# Patient Record
Sex: Female | Born: 1955
Health system: Southern US, Community
[De-identification: ages and names within clinical notes are randomized; demographics above are authoritative.]

## PROBLEM LIST (undated history)

## (undated) DIAGNOSIS — C50511 Malignant neoplasm of lower-outer quadrant of right female breast: Secondary | ICD-10-CM

## (undated) DIAGNOSIS — C50919 Malignant neoplasm of unspecified site of unspecified female breast: Secondary | ICD-10-CM

## (undated) DIAGNOSIS — Z923 Personal history of irradiation: Secondary | ICD-10-CM

## (undated) DIAGNOSIS — M722 Plantar fascial fibromatosis: Secondary | ICD-10-CM

## (undated) HISTORY — DX: Plantar fascial fibromatosis: M72.2

## (undated) HISTORY — PX: TONSILLECTOMY: SUR1361

## (undated) HISTORY — PX: FEMUR FRACTURE SURGERY: SHX633

---

## 2002-05-01 ENCOUNTER — Encounter: Admission: RE | Admit: 2002-05-01 | Discharge: 2002-05-01 | Payer: Self-pay | Admitting: Internal Medicine

## 2002-05-01 ENCOUNTER — Encounter: Payer: Self-pay | Admitting: Internal Medicine

## 2003-05-14 ENCOUNTER — Encounter: Admission: RE | Admit: 2003-05-14 | Discharge: 2003-05-14 | Payer: Self-pay | Admitting: Internal Medicine

## 2004-09-03 ENCOUNTER — Other Ambulatory Visit: Admission: RE | Admit: 2004-09-03 | Discharge: 2004-09-03 | Payer: Self-pay | Admitting: Obstetrics and Gynecology

## 2004-09-23 ENCOUNTER — Encounter: Admission: RE | Admit: 2004-09-23 | Discharge: 2004-09-23 | Payer: Self-pay | Admitting: Internal Medicine

## 2004-10-13 ENCOUNTER — Encounter (INDEPENDENT_AMBULATORY_CARE_PROVIDER_SITE_OTHER): Payer: Self-pay | Admitting: Specialist

## 2004-10-13 ENCOUNTER — Ambulatory Visit (HOSPITAL_COMMUNITY): Admission: RE | Admit: 2004-10-13 | Discharge: 2004-10-13 | Payer: Self-pay | Admitting: *Deleted

## 2005-09-25 ENCOUNTER — Other Ambulatory Visit: Admission: RE | Admit: 2005-09-25 | Discharge: 2005-09-25 | Payer: Self-pay | Admitting: Obstetrics and Gynecology

## 2005-09-30 ENCOUNTER — Encounter: Admission: RE | Admit: 2005-09-30 | Discharge: 2005-09-30 | Payer: Self-pay | Admitting: Internal Medicine

## 2005-11-21 ENCOUNTER — Inpatient Hospital Stay (HOSPITAL_COMMUNITY): Admission: EM | Admit: 2005-11-21 | Discharge: 2005-11-29 | Payer: Self-pay | Admitting: Emergency Medicine

## 2006-12-30 ENCOUNTER — Encounter: Admission: RE | Admit: 2006-12-30 | Discharge: 2006-12-30 | Payer: Self-pay | Admitting: Family Medicine

## 2008-12-05 ENCOUNTER — Encounter: Admission: RE | Admit: 2008-12-05 | Discharge: 2008-12-05 | Payer: Self-pay | Admitting: Family Medicine

## 2009-03-21 ENCOUNTER — Other Ambulatory Visit: Admission: RE | Admit: 2009-03-21 | Discharge: 2009-03-21 | Payer: Self-pay | Admitting: Family Medicine

## 2010-02-16 ENCOUNTER — Encounter: Payer: Self-pay | Admitting: Family Medicine

## 2010-06-13 NOTE — Discharge Summary (Signed)
[name], Mitchell              ACCOUNT NO.:  [id number]   MEDICAL RECORD NO.:  [id number]          PATIENT TYPE:  INP   LOCATION:  5019                         FACILITY:  [facility]   PHYSICIAN:  Vanita Panda. Magnus Ivan, M.D.DATE OF BIRTH:  [birth year]   DATE OF ADMISSION:  11/21/2005  DATE OF DISCHARGE:  11/29/2005                                 DISCHARGE SUMMARY   ADMITTING DIAGNOSIS:  Left intertrochanteric hip fracture.   DISCHARGE DIAGNOSIS:  Status post open reduction internal fixation of left  intertrochanteric hip fracture.   PROCEDURES:  Open reduction internal fixation of left intertrochanteric hip  fracture on 11/21/2005.   HOSPITAL COURSE:  Briefly Teresa Mitchell is a 55 year old who had accident  involving being thrown off of a horse.  On the day of admission she was  found to have a completely displaced left intertrochanteric hip fracture of  the proximal femur.  She was taken to the operating room on the day of  admission where she underwent open reduction internal fixation using  intramedullary implant.  She was then admitted to orthopedic floor bed with  only touchdown weightbearing on that leg.  She did have an acute blood loss  anemia from her surgery and accident and did require a transfusion of 2  units of packed red blood cells during her admission.  She began working  with physical therapy, occupational therapy and by the day of discharge was  deemed safe for discharge to home with home health therapy set up to  continue help with ambulation.  The remainder of hospital course was  uneventful and she was transitioned to oral pain medications as well as  tolerated a regular diet as well.   DISPOSITION:  To home.   DISCHARGE MEDICATIONS:  While at home she should continue on a stool  softener as well as iron sulfate 300 mg p.o. t.i.d. for the next week.  Also  provide her prescriptions for oxycodone and Robaxin p.r.n. for pain and  spasms.  A follow-up  appointment should be established in Dr. Eliberto Ivory  office in a week after discharge.  She can get her incision wet starting to  shower on Tuesday and should just keep it clean and dry and change dressings  p.r.n.           ______________________________  Vanita Panda. Magnus Ivan, M.D.     CYB/MEDQ  D:  11/29/2005  T:  11/30/2005  Job:  [id number]

## 2010-06-13 NOTE — Op Note (Signed)
NAMESHARMANE, DAME              ACCOUNT NO.:  [id number]   MEDICAL RECORD NO.:  [id number]          PATIENT TYPE:  INP   LOCATION:  1826                         FACILITY:  [facility]   PHYSICIAN:  Vanita Panda. Magnus Ivan, M.D.DATE OF BIRTH:  Jun 25, 1955   DATE OF PROCEDURE:  11/22/2005  DATE OF DISCHARGE:                                 OPERATIVE REPORT   PREOPERATIVE DIAGNOSIS:  Left comminuted and significantly displaced  intertrochanteric femur fracture.   POSTOPERATIVE DIAGNOSIS:  Left comminuted and significantly displaced  intertrochanteric femur fracture.   PROCEDURE:  Open reduction internal fixation of left intertrochanteric femur  fracture using intramedullary hip screw and allograft bone graft.   IMPLANTS:  Smith and Nephew 10 x 34 intramedullary hip nail with 85 mm lag  screws.   SURGEON:  Vanita Panda. Magnus Ivan, M.D.   ANESTHESIA:  General.   ANTIBIOTICS:  1 gram IV Ancef.   BLOOD LOSS:  400 mL.   COMPLICATIONS:  None.   INDICATIONS:  Briefly Ms. Mamula is a 50-year who had an accident involving  a horse this afternoon, when the horse was going at a high rate of speed and  she fell and landed on her left hip.  She was transported via EMS to [facility] emergency room where x-rays were obtained.  She is found to have a 100%  displaced and comminuted intertrochanteric femur fracture.  There was  significant rotation in this area and it was recommend she under go fixation  using a hip screw type of device.  The risks, benefits of this were  explained to her and well understood and she agreed to proceed with surgery.   PROCEDURE:  After informed consent was obtained and appropriate left leg was  marked, she was brought to the operating room, placed supine on the fracture  table.  General anesthesia was then obtained.  I then placed the affected  left leg in longitudinal traction, the right leg was abducted and flexed out  of the way with a leg holder with  appropriate padding.  Her hip was prepped  and draped with DuraPrep and sterile drapes and sterile shower curtain was  utilized.  Right away it was noted significant comminution of the fracture  site and under live fluoro before making the incision, I tried to perform in-  line traction and rotation to the fracture as close as possible and this was  quite difficult.  I then made decision to make large incision over the hip  centered over the greater trochanter, because I knew that the reduction  would be difficult.  Right away I found significant comminution of the  greater trochanter and significant rotation of fracture.  Further  manipulation with adduction and internal rotation helped me reduced the  fracture to near anatomic but it was still quite difficult due to the nature  of the comminution.  I held the fracture fragments in place and then I used  initiating reamer to open up the greater trochanter with size 17 mm reamer.  I then placed a guide pin once the intramedullary canal was opened and  reamed the femur in 5 mm increments from 9 mm up to 11 mm.  I then chose a  10 x 34 mm IMHS after measuring the length of the femur.  I then placed this  in antegrade manner, holding the fractures as anatomically reduced as  possible.  However, again there was noted to be such heavy significant  comminution, that it was difficult to hold this.  Next a lag screw was then  drilled through the lateral cortex through a separate stab incision and I  measured this to be a 85 mm lag screw and was able to verify this in AP and  lateral positions and then brought the C-arm in 5-mm increments from a  direct lateral up to the AP to confirm that the guide pin was in a center-  center position.  Next this was drilled and then I tapped this and then  placed a guide pin.  I tried to place a compression component as well to  compress the fracture.  Again significant comminution as well as rotation  was noted  and I do feel I got as maximized a rotated and anatomically  reduced position as possible.  I then irrigated the wound thoroughly and  placed cancellous chips all around the fracture site.  I then closed the  deep layer with interrupted #1 Vicryl suture followed by 0 Vicryl in deeper  tissues, 2-0 Vicryl in subcutaneous tissue and staples on the skin also and  closed the separate incision for lag screw with 2-0 Vicryl and staples as  well.  I under direct fluoroscopy then put the leg through a range of motion  and there was no block of this motion and it did move as a unit under  fluoroscopy.  Her leg length once I took her off the fracture table appeared  normal with several rotation.  We certainly we did place xeroform and  sterile dressings, well-padded dressing over the wound, prior to moving her  off the table.  She was then awakened, extubated, taken to recovery room in  stable condition.  Final counts were correct and there were no complications  noted.  Again final blood loss was 400 mL.  Postoperatively I will likely  have her just touchdown weightbearing due to the significant comminution of  the fracture.           ______________________________  Vanita Panda. Magnus Ivan, M.D.     CYB/MEDQ  D:  11/22/2005  T:  11/23/2005  Job:  [id number]

## 2010-06-13 NOTE — Op Note (Signed)
Teresa Mitchell, Teresa Mitchell              ACCOUNT NO.:  [id number]   MEDICAL RECORD NO.:  [id number]          PATIENT TYPE:  AMB   LOCATION:  ENDO                         FACILITY:  [facility]   PHYSICIAN:  Georgiana Spinner, M.D.    DATE OF BIRTH:  1955/09/27   DATE OF PROCEDURE:  10/13/2004  DATE OF DISCHARGE:                                 OPERATIVE REPORT   PROCEDURE:  Colonoscopy with biopsy and polypectomy.   INDICATIONS:  Colon polyp.   PROCEDURE:  With patient mildly sedated in the left lateral decubitus  position, the Olympus videoscopic colonoscope was inserted in the rectum and  then passed under direct vision to the cecum, identified by ileocecal valve  and appendiceal orifice, both which were photographed.  From this point, the  colonoscope was slowly withdrawn taking circumferential views of colonic  mucosa, stopping at 20 cm from anal verge where a small polyp was seen and  removed using snare cautery technique, setting of 20/20 blended current.  We  next stopped in the rectum which appeared normal on direct and showed  another polyp on retroflexed view that was removed using hot biopsy forceps  technique with the same setting.  The endoscope was straightened and  withdrawn.  The patient's vital signs and pulse oximeter remained stable.  The patient tolerated the procedure well without complication.   FINDINGS:  Two small polyps, one at 20 cm, the other in the rectum,  otherwise unremarkable colonoscopic examination to the cecum.   PLAN:  Await biopsy report.  The patient will call me for results and follow-  up with me as an outpatient.           ______________________________  Georgiana Spinner, M.D.     GMO/MEDQ  D:  10/13/2004  T:  10/13/2004  Job:  [id number]   cc:   Darius Bump, M.D.  [address]  [place] [address]  Fax: [phone]

## 2013-08-15 ENCOUNTER — Ambulatory Visit (INDEPENDENT_AMBULATORY_CARE_PROVIDER_SITE_OTHER): Payer: No Typology Code available for payment source

## 2013-08-15 ENCOUNTER — Ambulatory Visit (INDEPENDENT_AMBULATORY_CARE_PROVIDER_SITE_OTHER): Payer: No Typology Code available for payment source | Admitting: Podiatry

## 2013-08-15 ENCOUNTER — Encounter: Payer: Self-pay | Admitting: Podiatry

## 2013-08-15 VITALS — BP 132/83 | HR 65 | Resp 16 | Ht 61.0 in | Wt 130.0 lb

## 2013-08-15 DIAGNOSIS — M722 Plantar fascial fibromatosis: Secondary | ICD-10-CM

## 2013-08-15 DIAGNOSIS — M21619 Bunion of unspecified foot: Secondary | ICD-10-CM

## 2013-08-15 DIAGNOSIS — M21612 Bunion of left foot: Secondary | ICD-10-CM

## 2013-08-15 MED ORDER — METHYLPREDNISOLONE (PAK) 4 MG PO TABS: ORAL_TABLET | ORAL | Status: DC

## 2013-08-15 MED ORDER — MELOXICAM 15 MG PO TABS: 15.0000 mg | ORAL_TABLET | Freq: Every day | ORAL | Status: DC

## 2013-08-15 NOTE — Progress Notes (Signed)
   Subjective:    Patient ID: Teresa Mitchell, female    DOB: 09-30-1955, 58 y.o.   MRN: [id number]  HPI Comments: Right plantar heel pain. It started around the 4th of July. Sharp pain plantar heel which has moved to around the sides of the heel. It is worse in the mornings. Using a heel cups to help with the pain. Left hallux pain   Foot Pain      Review of Systems  All other systems reviewed and are negative.      Objective:   Physical Exam: I have reviewed her past medical history medications allergies surgeries social history and review of systems. Pulses are strongly palpable bilateral. Neurologic sensorium is intact per Semmes-Weinstein monofilament. Deep tendon reflexes are intact bilateral muscle strength is 5 over 5 flexors plantar flexors inverters everters all intrinsic musculature is intact. Orthopedic evaluation demonstrates pain on palpation medial continued tubercle of the right heel. She also has mild hallux valgus deformity of the left foot. Radiographic evaluation does demonstrate soft tissue increase in density at the plantar fascial calcaneal insertion site of the right heel. Minimal increase in the first intermetatarsal angle left foot.        Assessment & Plan:  Assessment: Plantar fasciitis right. Mild hallux valgus left.  Plan: Discussed etiology pathology conservative versus surgical therapies. Injected the right heel today with Kenalog and local anesthetic. Put her in a plantar fascial brace and dispensed a night splint. Wrote her prescription for Medrol Dosepak to be followed by meloxicam and discussed appropriate shoe gear stretching exercises ice therapy and shoe gear modifications. We will also followup with her in one month.

## 2013-08-15 NOTE — Patient Instructions (Signed)
Plantar Fasciitis (Heel Spur Syndrome) with Rehab The plantar fascia is a fibrous, ligament-like, soft-tissue structure that spans the bottom of the foot. Plantar fasciitis is a condition that causes pain in the foot due to inflammation of the tissue. SYMPTOMS   Pain and tenderness on the underneath side of the foot.  Pain that worsens with standing or walking. CAUSES  Plantar fasciitis is caused by irritation and injury to the plantar fascia on the underneath side of the foot. Common mechanisms of injury include:  Direct trauma to bottom of the foot.  Damage to a small nerve that runs under the foot where the main fascia attaches to the heel bone.  Stress placed on the plantar fascia due to bone spurs. RISK INCREASES WITH:   Activities that place stress on the plantar fascia (running, jumping, pivoting, or cutting).  Poor strength and flexibility.  Improperly fitted shoes.  Tight calf muscles.  Flat feet.  Failure to warm-up properly before activity.  Obesity. PREVENTION  Warm up and stretch properly before activity.  Allow for adequate recovery between workouts.  Maintain physical fitness:  Strength, flexibility, and endurance.  Cardiovascular fitness.  Maintain a health body weight.  Avoid stress on the plantar fascia.  Wear properly fitted shoes, including arch supports for individuals who have flat feet. PROGNOSIS  If treated properly, then the symptoms of plantar fasciitis usually resolve without surgery. However, occasionally surgery is necessary. RELATED COMPLICATIONS   Recurrent symptoms that may result in a chronic condition.  Problems of the lower back that are caused by compensating for the injury, such as limping.  Pain or weakness of the foot during push-off following surgery.  Chronic inflammation, scarring, and partial or complete fascia tear, occurring more often from repeated injections. TREATMENT  Treatment initially involves the use of  ice and medication to help reduce pain and inflammation. The use of strengthening and stretching exercises may help reduce pain with activity, especially stretches of the Achilles tendon. These exercises may be performed at home or with a therapist. Your caregiver may recommend that you use heel cups of arch supports to help reduce stress on the plantar fascia. Occasionally, corticosteroid injections are given to reduce inflammation. If symptoms persist for greater than 6 months despite non-surgical (conservative), then surgery may be recommended.  MEDICATION   If pain medication is necessary, then nonsteroidal anti-inflammatory medications, such as aspirin and ibuprofen, or other minor pain relievers, such as acetaminophen, are often recommended.  Do not take pain medication within 7 days before surgery.  Prescription pain relievers may be given if deemed necessary by your caregiver. Use only as directed and only as much as you need.  Corticosteroid injections may be given by your caregiver. These injections should be reserved for the most serious cases, because they may only be given a certain number of times. HEAT AND COLD  Cold treatment (icing) relieves pain and reduces inflammation. Cold treatment should be applied for 10 to 15 minutes every 2 to 3 hours for inflammation and pain and immediately after any activity that aggravates your symptoms. Use ice packs or massage the area with a piece of ice (ice massage).  Heat treatment may be used prior to performing the stretching and strengthening activities prescribed by your caregiver, physical therapist, or athletic trainer. Use a heat pack or soak the injury in warm water. SEEK IMMEDIATE MEDICAL CARE IF:  Treatment seems to offer no benefit, or the condition worsens.  Any medications produce adverse side effects. EXERCISES RANGE   OF MOTION (ROM) AND STRETCHING EXERCISES - Plantar Fasciitis (Heel Spur Syndrome) These exercises may help you  when beginning to rehabilitate your injury. Your symptoms may resolve with or without further involvement from your physician, physical therapist or athletic trainer. While completing these exercises, remember:   Restoring tissue flexibility helps normal motion to return to the joints. This allows healthier, less painful movement and activity.  An effective stretch should be held for at least 30 seconds.  A stretch should never be painful. You should only feel a gentle lengthening or release in the stretched tissue. RANGE OF MOTION - Toe Extension, Flexion  Sit with your right / left leg crossed over your opposite knee.  Grasp your toes and gently pull them back toward the top of your foot. You should feel a stretch on the bottom of your toes and/or foot.  Hold this stretch for __________ seconds.  Now, gently pull your toes toward the bottom of your foot. You should feel a stretch on the top of your toes and or foot.  Hold this stretch for __________ seconds. Repeat __________ times. Complete this stretch __________ times per day.  RANGE OF MOTION - Ankle Dorsiflexion, Active Assisted  Remove shoes and sit on a chair that is preferably not on a carpeted surface.  Place right / left foot under knee. Extend your opposite leg for support.  Keeping your heel down, slide your right / left foot back toward the chair until you feel a stretch at your ankle or calf. If you do not feel a stretch, slide your bottom forward to the edge of the chair, while still keeping your heel down.  Hold this stretch for __________ seconds. Repeat __________ times. Complete this stretch __________ times per day.  STRETCH - Gastroc, Standing  Place hands on wall.  Extend right / left leg, keeping the front knee somewhat bent.  Slightly point your toes inward on your back foot.  Keeping your right / left heel on the floor and your knee straight, shift your weight toward the wall, not allowing your back to  arch.  You should feel a gentle stretch in the right / left calf. Hold this position for __________ seconds. Repeat __________ times. Complete this stretch __________ times per day. STRETCH - Soleus, Standing  Place hands on wall.  Extend right / left leg, keeping the other knee somewhat bent.  Slightly point your toes inward on your back foot.  Keep your right / left heel on the floor, bend your back knee, and slightly shift your weight over the back leg so that you feel a gentle stretch deep in your back calf.  Hold this position for __________ seconds. Repeat __________ times. Complete this stretch __________ times per day. STRETCH - Gastrocsoleus, Standing  Note: This exercise can place a lot of stress on your foot and ankle. Please complete this exercise only if specifically instructed by your caregiver.   Place the ball of your right / left foot on a step, keeping your other foot firmly on the same step.  Hold on to the wall or a rail for balance.  Slowly lift your other foot, allowing your body weight to press your heel down over the edge of the step.  You should feel a stretch in your right / left calf.  Hold this position for __________ seconds.  Repeat this exercise with a slight bend in your right / left knee. Repeat __________ times. Complete this stretch __________ times per day.    STRENGTHENING EXERCISES - Plantar Fasciitis (Heel Spur Syndrome)  These exercises may help you when beginning to rehabilitate your injury. They may resolve your symptoms with or without further involvement from your physician, physical therapist or athletic trainer. While completing these exercises, remember:   Muscles can gain both the endurance and the strength needed for everyday activities through controlled exercises.  Complete these exercises as instructed by your physician, physical therapist or athletic trainer. Progress the resistance and repetitions only as guided. STRENGTH -  Towel Curls  Sit in a chair positioned on a non-carpeted surface.  Place your foot on a towel, keeping your heel on the floor.  Pull the towel toward your heel by only curling your toes. Keep your heel on the floor.  If instructed by your physician, physical therapist or athletic trainer, add ____________________ at the end of the towel. Repeat __________ times. Complete this exercise __________ times per day. STRENGTH - Ankle Inversion  Secure one end of a rubber exercise band/tubing to a fixed object (table, pole). Loop the other end around your foot just before your toes.  Place your fists between your knees. This will focus your strengthening at your ankle.  Slowly, pull your big toe up and in, making sure the band/tubing is positioned to resist the entire motion.  Hold this position for __________ seconds.  Have your muscles resist the band/tubing as it slowly pulls your foot back to the starting position. Repeat __________ times. Complete this exercises __________ times per day.  Document Released: 01/12/2005 Document Revised: 04/06/2011 Document Reviewed: 04/26/2008 [employer] Patient Information 2015 [employer]. This information is not intended to replace advice given to you by your health care provider. Make sure you discuss any questions you have with your health care provider.  

## 2013-09-05 ENCOUNTER — Ambulatory Visit: Payer: Self-pay | Admitting: Podiatry

## 2013-09-12 ENCOUNTER — Encounter: Payer: Self-pay | Admitting: Podiatry

## 2013-09-12 ENCOUNTER — Ambulatory Visit (INDEPENDENT_AMBULATORY_CARE_PROVIDER_SITE_OTHER): Payer: No Typology Code available for payment source | Admitting: Podiatry

## 2013-09-12 VITALS — BP 126/73 | HR 62 | Resp 16

## 2013-09-12 DIAGNOSIS — M722 Plantar fascial fibromatosis: Secondary | ICD-10-CM

## 2013-09-12 NOTE — Progress Notes (Signed)
She presents today for followup of her plantar fasciitis. She states it is approximately 70-75% better.  Objective: Vital signs are stable she is alert and oriented x3. She is pain on palpation medial calcaneal tubercle to the right heel.  Assessment: Plantar fasciitis right foot.  Plan: Continue all conservative therapies and reinjected today with Kenalog and local anesthetic.

## 2015-01-30 ENCOUNTER — Encounter: Payer: Self-pay | Admitting: Allergy and Immunology

## 2015-01-30 ENCOUNTER — Ambulatory Visit (INDEPENDENT_AMBULATORY_CARE_PROVIDER_SITE_OTHER): Payer: BLUE CROSS/BLUE SHIELD | Admitting: Allergy and Immunology

## 2015-01-30 VITALS — BP 106/68 | HR 80 | Temp 97.9°F | Resp 20 | Ht 61.42 in | Wt 134.7 lb

## 2015-01-30 DIAGNOSIS — T7800XA Anaphylactic reaction due to unspecified food, initial encounter: Secondary | ICD-10-CM | POA: Diagnosis not present

## 2015-01-30 DIAGNOSIS — R1013 Epigastric pain: Secondary | ICD-10-CM

## 2015-01-30 DIAGNOSIS — R109 Unspecified abdominal pain: Secondary | ICD-10-CM | POA: Insufficient documentation

## 2015-01-30 NOTE — Patient Instructions (Addendum)
Food allergy Penda's history suggests the possibility of food allergy, particularly galactose-alpha-1,3-galactose (alpha-gal) hypersensitivity.  We will proceed with lab work to rule out alpha-gal sensitivity. Food allergen skin tests today were negative with the exception of pineapple. However Kelly eats this food on a regular basis without symptoms, therefore this represents a false positive.  A lab order has been provided for serum specific IgE against beef, pork, and lamb, as well as galactose-alpha-1,3-galactose IgE level.  Until this hypersensitivity has been ruled out, she is to meticulously avoid mammalian meat.   When lab results have returned the patient will be called with further recommendations and follow up instructions.

## 2015-01-30 NOTE — Progress Notes (Signed)
[facility]  New Patient Note  RE: Teresa Mitchell MRN: [id number] DOB: Oct 17, 1955 Date of Office Visit: 01/30/2015  Referring provider: Kelton Pillar, MD Primary care provider: Osborne Casco, MD  Chief Complaint: Food Intolerance   History of present illness: HPI Comments: Teresa Mitchell is a 59 y.o. female who presents today for her initial consultation of a possible food allergy. She reports that for approximately the past 6 months she has experienced episodes of abdominal discomfort, bloating, flatulence and diarrhea that these symptoms are triggered by the consumption of beef and, to a lesser degree, pork. She denies concomitant cutaneous or pulmonary symptoms.  She has a history of multiple tick bites.  She is able to eat poultry, fish, and seafood without symptoms.   Assessment and plan: Food allergy Teresa Mitchell's history suggests the possibility of food allergy, particularly galactose-alpha-1,3-galactose (alpha-gal) hypersensitivity.  We will proceed with lab work to rule out alpha-gal sensitivity. Food allergen skin tests today were negative with the exception of pineapple. However Teresa Mitchell eats this food on a regular basis without symptoms, therefore this represents a false positive.  A lab order has been provided for serum specific IgE against beef, pork, and lamb, as well as galactose-alpha-1,3-galactose IgE level.  Until this hypersensitivity has been ruled out, she is to meticulously avoid mammalian meat.    No orders of the defined types were placed in this encounter.    Diagnositics: Food allergen skin testing: Positive to pineapple.    Physical examination: Blood pressure 106/68, pulse 80, temperature 97.9 F (36.6 C), temperature source Oral, resp. rate 20, height 5' 1.42" (1.56 m), weight 134 lb 11.2 oz (61.1 kg).  General: Alert, interactive, in no acute distress. Lungs: Clear to auscultation  without wheezing, rhonchi or rales. CV: Normal S1, S2 without murmurs. Abdomen: Nondistended, nontender. Skin: Warm and dry, without lesions or rashes. Extremities:  No clubbing, cyanosis or edema. Neuro:   Grossly intact.  Review of systems: Review of Systems  Constitutional: Negative for fever, chills and weight loss.  HENT: Negative for congestion and nosebleeds.   Eyes: Negative for blurred vision.  Respiratory: Negative for hemoptysis, shortness of breath and wheezing.   Cardiovascular: Negative for chest pain.  Gastrointestinal: Positive for abdominal pain and diarrhea. Negative for constipation.  Genitourinary: Negative for dysuria.  Musculoskeletal: Negative for myalgias and joint pain.  Skin: Negative for itching and rash.  Neurological: Negative for dizziness.  Endo/Heme/Allergies: Does not bruise/bleed easily.    Past medical history: No past medical history on file.  Past surgical history: Past Surgical History  Procedure Laterality Date  . Femur fracture surgery Left   . Tonsillectomy      Family history: Family History  Problem Relation Age of Onset  . Asthma Maternal Grandfather   . Allergic rhinitis Neg Hx   . Angioedema Neg Hx   . Atopy Neg Hx   . Eczema Neg Hx   . Immunodeficiency Neg Hx   . Urticaria Neg Hx   . Migraines Maternal Grandmother     Social history: Social History   Social History  . Marital Status: Married    Spouse Name: N/A  . Number of Children: N/A  . Years of Education: N/A   Occupational History  . Not on file.   Social History Main Topics  . Smoking status: Never Smoker   . Smokeless tobacco: Never Used  . Alcohol Use: Not on file  . Drug Use: Not on  file  . Sexual Activity: Not on file   Other Topics Concern  . Not on file   Social History Narrative   Environmental History:  Teresa Mitchell lives in an 61 year old house with carpeting throughout and central air/heat.  There are 2 dogs and 1 cat in the house which do  not have access to her bedroom.  She is a former cigarette smoker.    Medication List       This list is accurate as of: 01/30/15 10:18 AM.  Always use your most recent med list.               BLACK COHOSH PO  Take by mouth.     Fish Oil Oil  by Does not apply route.     MAGNESIUM ASPARTATE PO  Take by mouth.     meloxicam 15 MG tablet  Commonly known as:  MOBIC  Take 1 tablet (15 mg total) by mouth daily.     MULTIVITAMIN & MINERAL PO  Take by mouth.     pravastatin 20 MG tablet  Commonly known as:  PRAVACHOL  Take 20 mg by mouth daily.     vitamin B-12 100 MCG tablet  Commonly known as:  CYANOCOBALAMIN  Take 100 mcg by mouth daily.        Known medication allergies: No Known Allergies  I appreciate the opportunity to take part in this Teresa Mitchell's care. Please do not hesitate to contact me with questions.  Sincerely,   R. Edgar Frisk, MD

## 2015-01-30 NOTE — Assessment & Plan Note (Addendum)
Aftan's history suggests the possibility of food allergy, particularly galactose-alpha-1,3-galactose (alpha-gal) hypersensitivity.  We will proceed with lab work to rule out alpha-gal sensitivity. Food allergen skin tests today were negative with the exception of pineapple. However Kamrin eats this food on a regular basis without symptoms, therefore this represents a false positive.  A lab order has been provided for serum specific IgE against beef, pork, and lamb, as well as galactose-alpha-1,3-galactose IgE level.  Until this hypersensitivity has been ruled out, she is to meticulously avoid mammalian meat.

## 2015-02-01 LAB — TRYPTASE: Tryptase: 3.7 ug/L (ref <11)

## 2015-02-04 LAB — ALPHA-GAL PANEL
Allergen, Mutton, f88: <0.1 kU/L
Allergen, Pork, f26: <0.1 kU/L
Beef: <0.1 kU/L
Galactose-alpha-1,3-galactose IgE: 0.34 kU/L (ref <0.35)

## 2015-03-11 ENCOUNTER — Other Ambulatory Visit: Payer: Self-pay | Admitting: Gastroenterology

## 2016-01-09 DIAGNOSIS — Z0000 Encounter for general adult medical examination without abnormal findings: Secondary | ICD-10-CM | POA: Diagnosis not present

## 2016-01-09 DIAGNOSIS — Z1159 Encounter for screening for other viral diseases: Secondary | ICD-10-CM | POA: Diagnosis not present

## 2016-01-09 DIAGNOSIS — E7800 Pure hypercholesterolemia, unspecified: Secondary | ICD-10-CM | POA: Diagnosis not present

## 2016-01-09 DIAGNOSIS — M1990 Unspecified osteoarthritis, unspecified site: Secondary | ICD-10-CM | POA: Diagnosis not present

## 2016-01-09 DIAGNOSIS — K219 Gastro-esophageal reflux disease without esophagitis: Secondary | ICD-10-CM | POA: Diagnosis not present

## 2016-01-27 DIAGNOSIS — C50919 Malignant neoplasm of unspecified site of unspecified female breast: Secondary | ICD-10-CM

## 2016-01-27 DIAGNOSIS — Z923 Personal history of irradiation: Secondary | ICD-10-CM

## 2016-01-27 HISTORY — DX: Personal history of irradiation: Z92.3

## 2016-01-27 HISTORY — DX: Malignant neoplasm of unspecified site of unspecified female breast: C50.919

## 2016-02-25 DIAGNOSIS — Z1389 Encounter for screening for other disorder: Secondary | ICD-10-CM | POA: Diagnosis not present

## 2016-02-25 DIAGNOSIS — Z6827 Body mass index (BMI) 27.0-27.9, adult: Secondary | ICD-10-CM | POA: Diagnosis not present

## 2016-02-25 DIAGNOSIS — Z01419 Encounter for gynecological examination (general) (routine) without abnormal findings: Secondary | ICD-10-CM | POA: Diagnosis not present

## 2016-02-25 DIAGNOSIS — Z130 Encounter for screening for diseases of the blood and blood-forming organs and certain disorders involving the immune mechanism: Secondary | ICD-10-CM | POA: Diagnosis not present

## 2016-02-25 DIAGNOSIS — Z1231 Encounter for screening mammogram for malignant neoplasm of breast: Secondary | ICD-10-CM | POA: Diagnosis not present

## 2016-02-27 ENCOUNTER — Other Ambulatory Visit: Payer: Self-pay | Admitting: Obstetrics and Gynecology

## 2016-02-27 DIAGNOSIS — R928 Other abnormal and inconclusive findings on diagnostic imaging of breast: Secondary | ICD-10-CM

## 2016-02-28 ENCOUNTER — Ambulatory Visit
Admission: RE | Admit: 2016-02-28 | Discharge: 2016-02-28 | Disposition: A | Discharge status: Home / Self Care | Payer: BLUE CROSS/BLUE SHIELD | Source: Ambulatory Visit | Attending: Obstetrics and Gynecology | Admitting: Obstetrics and Gynecology

## 2016-02-28 ENCOUNTER — Other Ambulatory Visit: Payer: Self-pay | Admitting: Obstetrics and Gynecology

## 2016-02-28 DIAGNOSIS — R928 Other abnormal and inconclusive findings on diagnostic imaging of breast: Secondary | ICD-10-CM

## 2016-02-28 DIAGNOSIS — N6310 Unspecified lump in the right breast, unspecified quadrant: Secondary | ICD-10-CM

## 2016-02-28 DIAGNOSIS — N6311 Unspecified lump in the right breast, upper outer quadrant: Secondary | ICD-10-CM | POA: Diagnosis not present

## 2016-03-02 ENCOUNTER — Ambulatory Visit
Admission: RE | Admit: 2016-03-02 | Discharge: 2016-03-02 | Disposition: A | Discharge status: Home / Self Care | Payer: BLUE CROSS/BLUE SHIELD | Source: Ambulatory Visit | Attending: Obstetrics and Gynecology | Admitting: Obstetrics and Gynecology

## 2016-03-02 ENCOUNTER — Other Ambulatory Visit: Payer: Self-pay | Admitting: Obstetrics and Gynecology

## 2016-03-02 DIAGNOSIS — N6310 Unspecified lump in the right breast, unspecified quadrant: Secondary | ICD-10-CM

## 2016-03-02 DIAGNOSIS — C50511 Malignant neoplasm of lower-outer quadrant of right female breast: Secondary | ICD-10-CM | POA: Diagnosis not present

## 2016-03-04 ENCOUNTER — Telehealth: Payer: Self-pay | Admitting: *Deleted

## 2016-03-04 HISTORY — PX: BREAST BIOPSY: SHX20

## 2016-03-04 NOTE — Telephone Encounter (Signed)
Confirmed [facility] for 03/11/16 at 1215 .  Instructions and contact information given.

## 2016-03-10 ENCOUNTER — Other Ambulatory Visit: Payer: Self-pay | Admitting: *Deleted

## 2016-03-10 DIAGNOSIS — C50511 Malignant neoplasm of lower-outer quadrant of right female breast: Secondary | ICD-10-CM

## 2016-03-10 DIAGNOSIS — Z170 Estrogen receptor positive status [ER+]: Principal | ICD-10-CM

## 2016-03-11 ENCOUNTER — Other Ambulatory Visit (HOSPITAL_BASED_OUTPATIENT_CLINIC_OR_DEPARTMENT_OTHER): Payer: BLUE CROSS/BLUE SHIELD

## 2016-03-11 ENCOUNTER — Ambulatory Visit: Payer: Self-pay | Admitting: Surgery

## 2016-03-11 ENCOUNTER — Encounter: Payer: Self-pay | Admitting: Hematology and Oncology

## 2016-03-11 ENCOUNTER — Ambulatory Visit (HOSPITAL_BASED_OUTPATIENT_CLINIC_OR_DEPARTMENT_OTHER): Payer: BLUE CROSS/BLUE SHIELD | Admitting: Hematology and Oncology

## 2016-03-11 ENCOUNTER — Ambulatory Visit: Payer: BLUE CROSS/BLUE SHIELD | Attending: Surgery | Admitting: Physical Therapy

## 2016-03-11 ENCOUNTER — Encounter: Payer: Self-pay | Admitting: Physical Therapy

## 2016-03-11 ENCOUNTER — Ambulatory Visit
Admission: RE | Admit: 2016-03-11 | Discharge: 2016-03-11 | Disposition: A | Discharge status: Home / Self Care | Payer: BLUE CROSS/BLUE SHIELD | Source: Ambulatory Visit | Attending: Radiation Oncology | Admitting: Radiation Oncology

## 2016-03-11 DIAGNOSIS — C50511 Malignant neoplasm of lower-outer quadrant of right female breast: Secondary | ICD-10-CM | POA: Insufficient documentation

## 2016-03-11 DIAGNOSIS — R293 Abnormal posture: Secondary | ICD-10-CM

## 2016-03-11 DIAGNOSIS — Z170 Estrogen receptor positive status [ER+]: Secondary | ICD-10-CM | POA: Insufficient documentation

## 2016-03-11 DIAGNOSIS — C50911 Malignant neoplasm of unspecified site of right female breast: Secondary | ICD-10-CM | POA: Diagnosis not present

## 2016-03-11 DIAGNOSIS — C50411 Malignant neoplasm of upper-outer quadrant of right female breast: Secondary | ICD-10-CM

## 2016-03-11 DIAGNOSIS — F17210 Nicotine dependence, cigarettes, uncomplicated: Secondary | ICD-10-CM | POA: Diagnosis not present

## 2016-03-11 LAB — COMPREHENSIVE METABOLIC PANEL
ALBUMIN: 4.4 g/dL (ref 3.5–5.0)
ALK PHOS: 60 U/L (ref 40–150)
ALT: 25 U/L (ref 0–55)
ANION GAP: 10 meq/L (ref 3–11)
AST: 21 U/L (ref 5–34)
BUN: 9.1 mg/dL (ref 7.0–26.0)
CO2: 26 mEq/L (ref 22–29)
Calcium: 9.9 mg/dL (ref 8.4–10.4)
Chloride: 106 mEq/L (ref 98–109)
Creatinine: 0.7 mg/dL (ref 0.6–1.1)
EGFR: 90 mL/min/{1.73_m2} — AB (ref >90)
GLUCOSE: 99 mg/dL (ref 70–140)
POTASSIUM: 3.9 meq/L (ref 3.5–5.1)
SODIUM: 141 meq/L (ref 136–145)
Total Bilirubin: 0.95 mg/dL (ref 0.20–1.20)
Total Protein: 7.4 g/dL (ref 6.4–8.3)

## 2016-03-11 LAB — CBC WITH DIFFERENTIAL/PLATELET
BASO%: 0.9 % (ref 0.0–2.0)
BASOS ABS: 0 10*3/uL (ref 0.0–0.1)
EOS ABS: 0.1 10*3/uL (ref 0.0–0.5)
EOS%: 1.4 % (ref 0.0–7.0)
HCT: 41.8 % (ref 34.8–46.6)
HEMOGLOBIN: 13.9 g/dL (ref 11.6–15.9)
LYMPH%: 35.9 % (ref 14.0–49.7)
MCH: 29.8 pg (ref 25.1–34.0)
MCHC: 33.2 g/dL (ref 31.5–36.0)
MCV: 89.8 fL (ref 79.5–101.0)
MONO#: 0.3 10*3/uL (ref 0.1–0.9)
MONO%: 6.6 % (ref 0.0–14.0)
NEUT#: 2.8 10*3/uL (ref 1.5–6.5)
NEUT%: 55.2 % (ref 38.4–76.8)
PLATELETS: 177 10*3/uL (ref 145–400)
RBC: 4.65 10*6/uL (ref 3.70–5.45)
RDW: 13.7 % (ref 11.2–14.5)
WBC: 5 10*3/uL (ref 3.9–10.3)
lymph#: 1.8 10*3/uL (ref 0.9–3.3)

## 2016-03-11 NOTE — Progress Notes (Signed)
Nutrition Assessment  Reason for Assessment:  Pt seen in Breast Clinic  ASSESSMENT:   61 year old female with new diagnosis of right breast cancer. Past medical history reviewed  Patient reports normal appetite has been enrolled in weight watchers  Medications:  reviewed  Labs: reviewed  Anthropometrics:   Height: 61 inches Weight: 137 lb BMI: 25.2   NUTRITION DIAGNOSIS: Food and nutrition related knowledge deficit related to new diagnosis of breast cancer as evidenced by no prior need for nutrition related information.  INTERVENTION:   Discussed and provided packet of information regarding nutritional tips for breast cancer patients.  Questions answered.  Teachback method used.  Contact information provided.    MONITORING, EVALUATION, and GOAL: Pt will consume a healthy plant based diet to maintain lean body mass throughout treatment.   Jazper Nikolai B. Zenia Resides, [place], [place] (pager)

## 2016-03-11 NOTE — H&P (Signed)
Teresa Mitchell 03/11/2016 7:35 AM Location: [facility] Patient #: [id number] DOB: 11/30/1955 Undefined / Language: Teresa Mitchell / Race: White Female  History of Present Illness Teresa Moores A. Cornett MD; 03/11/2016 2:32 PM) Patient words: patient sent at the request of Dr. Garnet Mitchell for right breast cancer. This was picked up on the screening mammogram. She was found to have a0.5 cm mass upper outer quadrant right breast ER positive PR positive HER-2/neu negative with a Ki-67 12%. Patient denies any history of breast . pain nipple discharge problem with either breast.       ADDITIONAL INFORMATION: PROGNOSTIC INDICATORS Results: IMMUNOHISTOCHEMICAL AND MORPHOMETRIC ANALYSIS PERFORMED MANUALLY Estrogen Receptor: 90%, POSITIVE, STRONG STAINING INTENSITY Progesterone Receptor: 20%, POSITIVE, STRONG STAINING INTENSITY Proliferation Marker Ki67: 12% REFERENCE RANGE ESTROGEN RECEPTOR NEGATIVE 0% POSITIVE =>1% REFERENCE RANGE PROGESTERONE RECEPTOR NEGATIVE 0% POSITIVE =>1% All controls stained appropriately Enid Cutter MD Pathologist, Electronic Signature ( Signed 03/05/2016) FLUORESCENCE IN-SITU HYBRIDIZATION Results: HER2 - NEGATIVE RATIO OF HER2/CEP17 SIGNALS 1.19 AVERAGE HER2 COPY NUMBER PER CELL 2.50 Reference Range: NEGATIVE HER2/CEP17 Ratio <2.0 and average HER2 copy number <4.0 EQUIVOCAL HER2/CEP17 Ratio <2.0 and average HER2 copy number >=4.0 and <6.0 1 of 3 FINAL for Teresa Mitchell ([id number]) ADDITIONAL INFORMATION:(continued) POSITIVE HER2/CEP17 Ratio >=2.0 or <2.0 and average HER2 copy number >=6.0 Enid Cutter MD Pathologist, Electronic Signature ( Signed 03/05/2016) The malignant cells are positive for Mitchell-Cadherin, supporting a ductal phenotype. (JBK:gt, 03/03/16) Enid Cutter MD Pathologist, Electronic Signature ( Signed 03/03/2016) FINAL DIAGNOSIS Diagnosis Breast, right, needle core biopsy, 8:00 o'clock - INVASIVE MAMMARY CARCINOMA. - ADENOSIS WITH  CALCIFICATIONS. - SEE COMMENT. Microscopic Comment The carcinoma appears grade 1-2. An Mitchell-Cadherin stain will be performed and the results reported separately. A breast prognostic profile will be performed and the results reported separately. The results were called to [facility] on 03/03/16. (JBK:gt, 03/03/16) Enid Cutter MD Pathologist, Electronic Signature (Case signed 03/03/2016) Specimen Gross and Clinical Information Specimen Comment In formalin 3:20 extracted less than 5 min; right breast mass Specimen(s) Obtained: Breast, right, needle core biopsy, 8:00 o'clock Specimen Clinical Information Right breast mass Gross Received labeled "Teresa Mitchell" and "Right breast 8:00" (TIF 1520 CIT <47mn) are 4 cores of yellow to gray white soft to firm tissue, ranging from 0.8 x 0.1 x 0.1 cm to 1 x 0.1 x 0.1 cm. One block submitted. (SSW 2/5) 2 of 3 FINAL for Teresa Mitchell ([id number] Stain(s) used in Diagnosis: The following stain(s) were used in diagnosing the case: Her2 FISH, PR-ACIS, KI-67-ACIS, ER-ACIS, Mitchell-CAD. The control(s) stained appropriately. Disclaimer Estrogen receptor (6F11), immunohistochemical stains are performed on formalin fixed, paraffin embedded tissue using a 3,3"-diaminobenzidine (DAB) chromogen and Leica Bond Autostainer System. The staining intensity of the nucleus is scored manually and is reported as the percentage of tumor cell nuclei demonstrating specific nuclear staining. Ki-67 (MM1), immunohistochemical stains are performed on formalin fixed, paraffin embedded tissue using a 3,3"-diaminobenzidine (DAB) chromogen and Leica Bond Autostainer System. The staining intensity of the nucleus is scored manually and is reported as the percentage of tumor cell nuclei demonstrating specific nuclear staining. HER2 IQFISH pharmDX (code [id number] is a direct fluorescence in-situ hybridization assay designed to quantitatively determine HER2 gene amplification  in formalin-fixed, paraffin-embedded tissue specimens. [facility] reported using ASCO/CAP scoring criteria published in 2013. Some of these immunohistochemical stains may have been developed and the performance characteristics determined by [facility] Some may not have been cleared or approved by the  U.S. Food and Drug Administration. The FDA has determined that such clearance or approval is not necessary. This test is used for clinical purposes. It should not be regarded as investigational or for research. This laboratory is certified under the [place] (CLIA-88) as qualified to perform high complexity clinical laboratory testing. PR progesterone receptor (16), immunohistochemical stains are performed on formalin fixed, paraffin embedded tissue using a 3,3"-diaminobenzidine (DAB) chromogen and Leica Bond Autostainer System. The staining intensity of the nucleus is scored manually and is reported as the percentage of tumor cell nuclei demonstrating specific nuclear staining. Report signed out from the following location(s) Technical Component was performed at [facility]. [address],[place] [address].NOTR:[id number]., Technical component and interpretation was performed at [address], [place],  [address]. CLIA #: [id number], 3 of         CLINICAL DATA: 61 year old female for evaluation of possible right breast mass on screening mammogram. EXAM: 2D DIGITAL DIAGNOSTIC RIGHT MAMMOGRAM WITH CAD AND ADJUNCT TOMO ULTRASOUND RIGHT BREAST COMPARISON: Previous exam(s). ACR Breast Density Category b: There are scattered areas of fibroglandular density. FINDINGS: 2D and 3D full field views of both breasts demonstrate a 5 mm partially circumscribed partially obscured oval mass within the posterior outer right breast.  Mammographic images were processed with CAD. On physical exam, no palpable abnormalities identified. Targeted ultrasound is performed, showing a 5 x 4 x 3 mm slightly irregular hypoechoic mass at the 8 o'clock position of the right breast 5 cm from the nipple, felt to represent the mammographic finding. No abnormal right axillary lymph nodes are identified. IMPRESSION: Suspicious 5 mm mass in the slightly lower outer right breast. Tissue sampling is recommended. No abnormal right axillary lymph nodes identified. RECOMMENDATION: Ultrasound-guided right breast biopsy, which will be arranged. I have discussed the findings and recommendations with the patient. Results were also provided in writing at the conclusion of the visit. If applicable, a reminder letter will be sent to the patient regarding the next appointment. BI-RADS CATEGORY 4: Suspicious. Electronically Signed By: Margarette Canada M.D. On: 02/28/2016 11:49 .  The patient is a 61 year old female.   Past Surgical History Tawni Pummel, RN; 03/11/2016 7:35 AM) Breast Biopsy Right. Cesarean Section - 1 Tonsillectomy  Diagnostic Studies History Tawni Pummel, RN; 03/11/2016 7:35 AM) Colonoscopy within last year Mammogram within last year Pap Smear 1-5 years ago  Medication History Tawni Pummel, RN; 03/11/2016 7:35 AM) Medications Reconciled  Social History Tawni Pummel, RN; 03/11/2016 7:35 AM) Alcohol use Moderate alcohol use. Caffeine use Coffee, Tea. No drug use Tobacco use Former smoker.  Family History Tawni Pummel, RN; 03/11/2016 7:35 AM) Breast Cancer Family Members In General. Cancer Family Members In General. Cerebrovascular Accident Family Members In General. Hypertension Father. Melanoma Family Members In General. Prostate Cancer Father. Seizure disorder Family Members In General.  Pregnancy / Birth History Tawni Pummel, RN; 03/11/2016 7:35 AM) Age at menarche 36  years. Age of menopause 51-55 Contraceptive History Intrauterine device, Oral contraceptives. Gravida 3 Length (months) of breastfeeding 3-6 Maternal age 20-25 Para 24  Other Problems Tawni Pummel, RN; 03/11/2016 7:35 AM) Back Pain Hypercholesterolemia Lump In Breast     Review of Systems Sunday Spillers Ledford RN; 03/11/2016 7:35 AM) General Not Present- Appetite Loss, Chills, Fatigue, Fever, Night Sweats, Weight Gain and Weight Loss. Skin Not Present- Change in Wart/Mole, Dryness, Hives, Jaundice, New Lesions, Non-Healing Wounds, Rash and Ulcer. HEENT Present- Seasonal Allergies and Wears glasses/contact lenses.  Not Present- Earache, Hearing Loss, Hoarseness, Nose Bleed, Oral Ulcers, Ringing in the Ears, Sinus Pain, Sore Throat, Visual Disturbances and Yellow Eyes. Respiratory Not Present- Bloody sputum, Chronic Cough, Difficulty Breathing, Snoring and Wheezing. Breast Present- Breast Mass and Breast Pain. Not Present- Nipple Discharge and Skin Changes. Cardiovascular Present- Rapid Heart Rate. Not Present- Chest Pain, Difficulty Breathing Lying Down, Leg Cramps, Palpitations, Shortness of Breath and Swelling of Extremities. Gastrointestinal Not Present- Abdominal Pain, Bloating, Bloody Stool, Change in Bowel Habits, Chronic diarrhea, Constipation, Difficulty Swallowing, Excessive gas, Gets full quickly at meals, Hemorrhoids, Indigestion, Nausea, Rectal Pain and Vomiting. Female Genitourinary Not Present- Frequency, Nocturia, Painful Urination, Pelvic Pain and Urgency. Musculoskeletal Present- Back Pain. Not Present- Joint Pain, Joint Stiffness, Muscle Pain, Muscle Weakness and Swelling of Extremities. Neurological Not Present- Decreased Memory, Fainting, Headaches, Numbness, Seizures, Tingling, Tremor, Trouble walking and Weakness. Psychiatric Not Present- Anxiety, Bipolar, Change in Sleep Pattern, Depression, Fearful and Frequent crying. Endocrine Not Present- Cold Intolerance,  Excessive Hunger, Hair Changes, Heat Intolerance, Hot flashes and New Diabetes. Hematology Not Present- Blood Thinners, Easy Bruising, Excessive bleeding, Gland problems, HIV and Persistent Infections.   Physical Exam (Thomas A. Cornett MD; 03/11/2016 2:32 PM)  General Mental Status-Alert. General Appearance-Consistent with stated age. Hydration-Well hydrated. Voice-Normal.  Head and Neck Head-normocephalic, atraumatic with no lesions or palpable masses. Trachea-midline. Thyroid Gland Characteristics - normal size and consistency.  Eye Eyeball - Bilateral-Extraocular movements intact. Sclera/Conjunctiva - Bilateral-No scleral icterus.  Chest and Lung Exam Chest and lung exam reveals -quiet, even and easy respiratory effort with no use of accessory muscles and on auscultation, normal breath sounds, no adventitious sounds and normal vocal resonance. Inspection Chest Wall - Normal. Back - normal.  Breast Breast - Left-Symmetric, Non Tender, No Biopsy scars, no Dimpling, No Inflammation, No Lumpectomy scars, No Mastectomy scars, No Peau d' Orange. Breast - Right-Symmetric, Non Tender, No Biopsy scars, no Dimpling, No Inflammation, No Lumpectomy scars, No Mastectomy scars, No Peau d' Orange. Breast Lump-No Palpable Breast Mass.  Cardiovascular Cardiovascular examination reveals -normal heart sounds, regular rate and rhythm with no murmurs and normal pedal pulses bilaterally.  Abdomen Inspection Inspection of the abdomen reveals - No Hernias. Skin - Scar - no surgical scars. Palpation/Percussion Palpation and Percussion of the abdomen reveal - Soft, Non Tender, No Rebound tenderness, No Rigidity (guarding) and No hepatosplenomegaly. Auscultation Auscultation of the abdomen reveals - Bowel sounds normal.  Neurologic Neurologic evaluation reveals -alert and oriented x 3 with no impairment of recent or remote memory. Mental  Status-Normal.  Musculoskeletal Normal Exam - Left-Upper Extremity Strength Normal and Lower Extremity Strength Normal. Normal Exam - Right-Upper Extremity Strength Normal and Lower Extremity Strength Normal.  Lymphatic Head & Neck  General Head & Neck Lymphatics: Bilateral - Description - Normal. Axillary  General Axillary Region: Bilateral - Description - Normal. Tenderness - Non Tender. Femoral & Inguinal  Generalized Femoral & Inguinal Lymphatics: Bilateral - Description - Normal. Tenderness - Non Tender.    Assessment & Plan (Thomas A. Cornett MD; 03/11/2016 2:33 PM)  BREAST CANCER, RIGHT (C50.911) Impression: discussed breast conservation versus mastectomy with reconstruction. Risks, benefits and alternatives to surgery discussed. She would like to proceed with right breas seed localized partial mastectomy with right axillary sentinel lymph node mapping. Risk of lumpectomy include bleeding, infection, seroma, more surgery, use of seed/wire, wound care, cosmetic deformity and the need for other treatments, death , blood clots, death. Pt agrees to proceed. Risk of sentinel lymph node mapping include bleeding, infection,  lymphedema, shoulder pain. stiffness, dye allergy. cosmetic deformity , blood clots, death, need for more surgery. Pt agres to proceed.  Current Plans You are being scheduled for surgery- Our schedulers will call you.  You should hear from our office's scheduling department within 5 working days about the location, date, and time of surgery. We try to make accommodations for patient's preferences in scheduling surgery, but sometimes the OR schedule or the surgeon's schedule prevents [place] from making those accommodations.  If you have not heard from our office [phone]) in 5 working days, call the office and ask for your surgeon's nurse.  If you have other questions about your diagnosis, plan, or surgery, call the office and ask for your surgeon's  nurse.  Pt Education - [facility] Breast Cancer Information Given - [facility] "Breast Journey" Package Pt Education - Pamphlet Given - Breast Biopsy: discussed with patient and provided information. We discussed the staging and pathophysiology of breast cancer. We discussed all of the different options for treatment for breast cancer including surgery, chemotherapy, radiation therapy, Herceptin, and antiestrogen therapy. We discussed a sentinel lymph node biopsy as she does not appear to having lymph node involvement right now. We discussed the performance of that with injection of radioactive tracer and blue dye. We discussed that she would have an incision underneath her axillary hairline. We discussed that there is a bout a 10-20% chance of having a positive node with a sentinel lymph node biopsy and we will await the permanent pathology to make any other first further decisions in terms of her treatment. One of these options might be to return to the operating room to perform an axillary lymph node dissection. We discussed about a 1-2% risk lifetime of chronic shoulder pain as well as lymphedema associated with a sentinel lymph node biopsy. We discussed the options for treatment of the breast cancer which included lumpectomy versus a mastectomy. We discussed the performance of the lumpectomy with a wire placement. We discussed a 10-20% chance of a positive margin requiring reexcision in the operating room. We also discussed that she may need radiation therapy or antiestrogen therapy or both if she undergoes lumpectomy. We discussed the mastectomy and the postoperative care for that as well. We discussed that there is no difference in her survival whether she undergoes lumpectomy with radiation therapy or antiestrogen therapy versus a mastectomy. There is a slight difference in the local recurrence rate being 3-5% with lumpectomy and about 1% with a mastectomy. We discussed the risks of operation including  bleeding, infection, possible reoperation. She understands her further therapy will be based on what her stages at the time of her operation.  Pt Education - [facility] breast cancer surgery: discussed with patient and provided information. Pt Education - [facility] Breast Biopsy HCI: discussed with patient and provided information.

## 2016-03-11 NOTE — Therapy (Signed)
[place], [place], [address] Phone: [phone]   Fax:  [phone]  Physical Therapy Evaluation  Patient Details  Name: Teresa Mitchell MRN: [id number] Date of Birth: 1955/02/03 Referring Provider: Dr. Erroll Luna  Encounter Date: 03/11/2016      PT End of Session - 03/11/16 1544    Visit Number 1   Number of Visits 1   PT Start Time 1456   PT Stop Time 1518   PT Time Calculation (min) 22 min   Activity Tolerance Patient tolerated treatment well   Behavior During Therapy [facility] for tasks assessed/performed      Past Medical History:  Diagnosis Date  . Plantar fasciitis     Past Surgical History:  Procedure Laterality Date  . CESAREAN SECTION    . FEMUR FRACTURE SURGERY Left   . TONSILLECTOMY      There were no vitals filed for this visit.       Subjective Assessment - 03/11/16 1545    Subjective Patient reports she is here to be seen by her medical team for her newly diagnosed right breast cancer.   Patient is accompained by: Family member   Pertinent History Patient was diagnosed on 02/25/16 with right grade 1-2 invasive ductal carcinoma breast cancer. It is ER/PR positive and HER2 negative with a Ki67 of 12%. It measures 5 mm and is located in the lower outer quadrant.   Patient Stated Goals Reduce lymphedema risk and learn post op shoulder ROM HEP            [facility] PT Assessment - 03/11/16 0001      Assessment   Medical Diagnosis Right breast cancer   Referring Provider Dr. Marcello Moores [place]   Onset Date/Surgical Date 02/25/16   Hand Dominance Right   Prior Therapy none     Precautions   Precautions Other (comment)   Precaution Comments active cancer     Restrictions   Weight Bearing Restrictions No     Balance Screen   Has the patient fallen in the past 6 months No   Has the patient had a decrease in activity level because of a fear of falling?  No   Is the patient  reluctant to leave their home because of a fear of falling?  No     Home Environment   Living Environment Private residence   Living Arrangements Spouse/significant other   Available Help at Discharge Family     Prior Function   Level of [place] Retired   Leisure Yoga and Pilates but none since 12/17     Cognition   Overall Cognitive Status Within Functional Limits for tasks assessed     Posture/Postural Control   Posture/Postural Control Postural limitations   Postural Limitations Rounded Shoulders;Forward head     ROM / Strength   AROM / PROM / Strength AROM;Strength     AROM   AROM Assessment Site Shoulder;Cervical   Right/Left Shoulder Right;Left   Right Shoulder Extension 54 Degrees   Right Shoulder Flexion 161 Degrees   Right Shoulder ABduction 170 Degrees   Right Shoulder Internal Rotation 65 Degrees   Right Shoulder External Rotation 85 Degrees   Left Shoulder Extension 57 Degrees   Left Shoulder Flexion 157 Degrees   Left Shoulder ABduction 171 Degrees   Left Shoulder Internal Rotation 65 Degrees   Left Shoulder External Rotation 90 Degrees   Cervical Flexion WNL   Cervical Extension WNL  Cervical - Right Side Bend WNL   Cervical - Left Side Bend WNL   Cervical - Right Rotation WNL   Cervical - Left Rotation WNL     Strength   Overall Strength Within functional limits for tasks performed           LYMPHEDEMA/ONCOLOGY QUESTIONNAIRE - 03/11/16 1543      Type   Cancer Type Right breast cancer     Lymphedema Assessments   Lymphedema Assessments Upper extremities     Right Upper Extremity Lymphedema   10 cm Proximal to Olecranon Process 30.4 cm   Olecranon Process 24.7 cm   10 cm Proximal to Ulnar Styloid Process 24.4 cm   Just Proximal to Ulnar Styloid Process 15.4 cm   Across Hand at [employer] 17.8 cm   At [place] of 2nd Digit 6 cm     Left Upper Extremity Lymphedema   10 cm Proximal to Olecranon Process 29.7 cm    Olecranon Process 24.7 cm   10 cm Proximal to Ulnar Styloid Process 22.7 cm   Just Proximal to Ulnar Styloid Process 15.4 cm   Across Hand at [employer] 17.1 cm   At [place] of 2nd Digit 5.6 cm       Patient was instructed today in a home exercise program today for post op shoulder range of motion. These included active assist shoulder flexion in sitting, scapular retraction, wall walking with shoulder abduction, and hands behind head external rotation.  She was encouraged to do these twice a day, holding 3 seconds and repeating 5 times when permitted by her physician.          PT Education - 03/11/16 1544    Education provided Yes   Education Details Post op shoulder ROM HEP and lymphedema risk reduction   Person(s) Educated Patient;Child(ren)   Methods Explanation;Demonstration;Handout   Comprehension Returned demonstration;Verbalized understanding              Breast Clinic Goals - 03/11/16 1547      Patient will be able to verbalize understanding of pertinent lymphedema risk reduction practices relevant to her diagnosis specifically related to skin care.   Time 1   Period Days   Status Achieved     Patient will be able to return demonstrate and/or verbalize understanding of the post-op home exercise program related to regaining shoulder range of motion.   Time 1   Period Days   Status Achieved     Patient will be able to verbalize understanding of the importance of attending the postoperative After Breast Cancer Class for further lymphedema risk reduction education and therapeutic exercise.   Time 1   Period Days   Status Achieved              Plan - 03/11/16 1545    Clinical Impression Statement Patient was diagnosed on 02/25/16 with right grade 1-2 invasive ductal carcinoma breast cancer. It is ER/PR positive and HER2 negative with a Ki67 of 12%. It measures 5 mm and is located in the lower outer quadrant. Her multidisciplinary medical team met prior  to her assessments to determine a recommended treatment plan.  She is planning to have a right lumpectomy and sentinel node biopsy followed by radiation and anti-estrogen therapy. She may benefit from post op PT to regain shoulder ROM and reduce lymphedema risk. Due ot her lack of comorbidities, her eval is of low complexity.   Rehab Potential Excellent   Clinical Impairments Affecting Rehab  Potential none   PT Frequency One time visit   PT Treatment/Interventions Patient/family education;Therapeutic exercise   PT Next Visit Plan Will f/u after surgery to determine needs   PT Home Exercise Plan Post op shoulder ROM HEP   Consulted and Agree with Plan of Care Patient;Family member/caregiver   Family Member Consulted Daughter      Patient will benefit from skilled therapeutic intervention in order to improve the following deficits and impairments:  Postural dysfunction, Decreased knowledge of precautions, Pain, Impaired UE functional use, Decreased range of motion  Visit Diagnosis: Carcinoma of lower-outer quadrant of right breast in female, estrogen receptor positive ([place]) - Plan: PT plan of care cert/re-cert  Abnormal posture - Plan: PT plan of care cert/re-cert   Patient will follow up at outpatient cancer rehab if needed following surgery.  If the patient requires physical therapy at that time, a specific plan will be dictated and sent to the referring physician for approval. The patient was educated today on appropriate basic range of motion exercises to begin post operatively and the importance of attending the After Breast Cancer class following surgery.  Patient was educated today on lymphedema risk reduction practices as it pertains to recommendations that will benefit the patient immediately following surgery.  She verbalized good understanding.  No additional physical therapy is indicated at this time.     Problem List Patient Active Problem List   Diagnosis Date Noted  .  Malignant neoplasm of lower-outer quadrant of right breast of female, estrogen receptor positive ([place]) 03/10/2016  . Food allergy 01/30/2015  . Abdominal pain 01/30/2015   Annia Friendly, PT 03/11/16 3:49 PM  [place], [place], [address] Phone: [phone]   Fax:  [phone]  Name: Teresa Mitchell MRN: [id number] Date of Birth: 12/21/1955

## 2016-03-11 NOTE — Patient Instructions (Signed)

## 2016-03-11 NOTE — Assessment & Plan Note (Signed)
Right breast biopsy 8:00: Invasive ductal carcinoma grade 1-2, ER 90%, PR 20%, Ki-67 12%, HER-2 negative, screening detected right breast mass LOQ posterior depth 8:00 position 5 mm, axilla negative, T1 1 N0 stage IA clinical stage  Pathology and radiology counseling: Discussed with the patient, the details of pathology including the type of breast cancer,the clinical staging, the significance of ER, PR and HER-2/neu receptors and the implications for treatment. After reviewing the pathology in detail, we proceeded to discuss the different treatment options between surgery, radiation,  antiestrogen therapies.  Recommendation: 1. Breast conserving surgery with sentinel lymph node biopsy 2. adjuvant radiation therapy 3. Followed by adjuvant antiestrogen therapy  Based on small size of the tumor, I do not think there is any need to do Oncotype DX testing. However the final pathology reveals a much larger size then we will consider doing Oncotype DX testing.  Return to clinic after surgery to discuss final pathology report.

## 2016-03-11 NOTE — Progress Notes (Signed)
[facility] CONSULT NOTE  Patient Care Team: Kelton Pillar, MD as PCP - General (Family Medicine) Paula Compton, MD as Consulting Physician (Obstetrics and Gynecology) Erroll Luna, MD as Consulting Physician (General Surgery) Nicholas Lose, MD as Consulting Physician (Hematology and Oncology) Kyung Rudd, MD as Consulting Physician (Radiation Oncology)  CHIEF COMPLAINTS/PURPOSE OF CONSULTATION:  Newly diagnosed breast cancer  HISTORY OF PRESENTING ILLNESS:  Teresa Mitchell 61 y.o. female is here because of recent diagnosis of right breast cancer. Patient had a screening mammogram the detected a right breast mass. This was further evaluated by ultrasound and a biopsy was then performed. Final pathology revealed that this was a invasive ductal carcinoma that was ER/PR positive HER-2 negative with a Ki-67 12%. She was presented this morning in the multidisciplinary tumor board and she is here today accompanied by her family to discuss the treatment plan.   I reviewed her records extensively and collaborated the history with the patient.  SUMMARY OF ONCOLOGIC HISTORY:   Malignant neoplasm of lower-outer quadrant of right breast of female, estrogen receptor positive ([place])   03/02/2016 Initial Diagnosis    Right breast biopsy 8:00: Invasive ductal carcinoma grade 1-2, ER 90%, PR 20%, Ki-67 12%, HER-2 negative, screening detected right breast mass LOQ posterior depth 8:00 position 5 mm, axilla negative, T1 1 N0 stage IA clinical stage       MEDICAL HISTORY:  Past Medical History:  Diagnosis Date  . Plantar fasciitis     SURGICAL HISTORY: Past Surgical History:  Procedure Laterality Date  . CESAREAN SECTION    . FEMUR FRACTURE SURGERY Left   . TONSILLECTOMY      SOCIAL HISTORY: Social History   Social History  . Marital status: Married    Spouse name: N/A  . Number of children: N/A  . Years of education: N/A   Occupational History  . Not on file.    Social History Main Topics  . Smoking status: Current Every Day Smoker    Types: E-cigarettes  . Smokeless tobacco: Never Used  . Alcohol use Yes     Comment: 3-4  . Drug use: No  . Sexual activity: Not on file   Other Topics Concern  . Not on file   Social History Narrative  . No narrative on file    FAMILY HISTORY: Family History  Problem Relation Age of Onset  . Asthma Maternal Grandfather   . Migraines Maternal Grandmother   . Allergic rhinitis Neg Hx   . Angioedema Neg Hx   . Atopy Neg Hx   . Eczema Neg Hx   . Immunodeficiency Neg Hx   . Urticaria Neg Hx     ALLERGIES:  has No Known Allergies.  MEDICATIONS:  Current Outpatient Prescriptions  Medication Sig Dispense Refill  . Biotin 1 MG CAPS Take by mouth.    . calcium carbonate (OSCAL) 1500 (600 Ca) MG TABS tablet Take 600 mg of elemental calcium by mouth 2 (two) times daily with a meal.    . Ibuprofen-Diphenhydramine HCl (ADVIL PM) 200-25 MG CAPS Take 1 tablet by mouth.    Marland Kitchen MAGNESIUM ASPARTATE PO Take 200 mg by mouth 2 (two) times daily.     . pravastatin (PRAVACHOL) 20 MG tablet Take 20 mg by mouth daily.    . Probiotic Product (PROBIOTIC-10 PO) Take 1 tablet by mouth.    . vitamin B-12 (CYANOCOBALAMIN) 100 MCG tablet Take 3,000 mcg by mouth daily.     Marland Kitchen BLACK COHOSH PO  Take by mouth.    . Fish Oil OIL by Does not apply route.    . meloxicam (MOBIC) 15 MG tablet Take 1 tablet (15 mg total) by mouth daily. (Patient not taking: Reported on 03/11/2016) 30 tablet 3  . Multiple Vitamins-Minerals (MULTIVITAMIN & MINERAL PO) Take by mouth.     No current facility-administered medications for this visit.     REVIEW OF SYSTEMS:   Constitutional: Denies fevers, chills or abnormal night sweats Eyes: Denies blurriness of vision, double vision or watery eyes Ears, nose, mouth, throat, and face: Denies mucositis or sore throat Respiratory: Denies cough, dyspnea or wheezes Cardiovascular: Denies palpitation, chest  discomfort or lower extremity swelling Gastrointestinal:  Denies nausea, heartburn or change in bowel habits Skin: Denies abnormal skin rashes Lymphatics: Denies new lymphadenopathy or easy bruising Neurological:Denies numbness, tingling or new weaknesses Behavioral/Psych: Mood is stable, no new changes  Breast:  Denies any palpable lumps or discharge All other systems were reviewed with the patient and are negative.  PHYSICAL EXAMINATION: ECOG PERFORMANCE STATUS: 0 - Asymptomatic  Vitals:   03/11/16 1300  BP: 124/76  Pulse: 62  Resp: 18  Temp: 97.8 F (36.6 C)   Filed Weights   03/11/16 1300  Weight: 137 lb 8 oz (62.4 kg)    GENERAL:alert, no distress and comfortable SKIN: skin color, texture, turgor are normal, no rashes or significant lesions EYES: normal, conjunctiva are pink and non-injected, sclera clear OROPHARYNX:no exudate, no erythema and lips, buccal mucosa, and tongue normal  NECK: supple, thyroid normal size, non-tender, without nodularity LYMPH:  no palpable lymphadenopathy in the cervical, axillary or inguinal LUNGS: clear to auscultation and percussion with normal breathing effort HEART: regular rate & rhythm and no murmurs and no lower extremity edema ABDOMEN:abdomen soft, non-tender and normal bowel sounds Musculoskeletal:no cyanosis of digits and no clubbing  PSYCH: alert & oriented x 3 with fluent speech NEURO: no focal motor/sensory deficits BREAST: No palpable nodules in breast. No palpable axillary or supraclavicular lymphadenopathy (exam performed in the presence of a chaperone)   LABORATORY DATA:  I have reviewed the data as listed Lab Results  Component Value Date   WBC 5.0 03/11/2016   HGB 13.9 03/11/2016   HCT 41.8 03/11/2016   MCV 89.8 03/11/2016   PLT 177 03/11/2016   Lab Results  Component Value Date   NA 141 03/11/2016   K 3.9 03/11/2016   CO2 26 03/11/2016    RADIOGRAPHIC STUDIES: I have personally reviewed the radiological  reports and agreed with the findings in the report.  ASSESSMENT AND PLAN:  Malignant neoplasm of lower-outer quadrant of right breast of female, estrogen receptor positive ([place]) Right breast biopsy 8:00: Invasive ductal carcinoma grade 1-2, ER 90%, PR 20%, Ki-67 12%, HER-2 negative, screening detected right breast mass LOQ posterior depth 8:00 position 5 mm, axilla negative, T1 1 N0 stage IA clinical stage  Pathology and radiology counseling: Discussed with the patient, the details of pathology including the type of breast cancer,the clinical staging, the significance of ER, PR and HER-2/neu receptors and the implications for treatment. After reviewing the pathology in detail, we proceeded to discuss the different treatment options between surgery, radiation,  antiestrogen therapies.  Recommendation: 1. Breast conserving surgery with sentinel lymph node biopsy 2. adjuvant radiation therapy 3. Followed by adjuvant antiestrogen therapy  Based on small size of the tumor, I do not think there is any need to do Oncotype DX testing. However the final pathology reveals a much larger size  then we will consider doing Oncotype DX testing.  Return to clinic after surgery to discuss final pathology report.    All questions were answered. The patient knows to call the clinic with any problems, questions or concerns.    Rulon Eisenmenger, MD 03/11/16

## 2016-03-11 NOTE — Progress Notes (Signed)
Radiation Oncology         [phone]) [phone] ________________________________  Name: Teresa Mitchell MRN: [id number]  Date: 03/11/2016  DOB: 1956-01-21  PJ:SUNHRVA,CQPEAK Thomasena Edis, MD  Harriette Bouillon, MD     REFERRING PHYSICIAN: Harriette Bouillon, MD   DIAGNOSIS: The encounter diagnosis was Malignant neoplasm of lower-outer quadrant of right breast of female, estrogen receptor positive (HCC). Clinical stage IA (T1aN0) ER/PR positive, grade 1-2 invasive ductal carcinoma of the right breast.  HISTORY OF PRESENT ILLNESS: Teresa Mitchell is a 61 y.o. female seen at the multidisciplinary breast clinic, with her daughter, for invasive ductal carcinoma of the right breast. The patient had a screening mammogram in January 2018 showing a possible right breast mass. The patient was then recalled for a diagnostic right breast mammogram and [place] on 02/28/16. Mammogram showed a 0.5 cm mass in the posterior out right breast. US showed a 0.5 x 0.4 x 0.3 cm irregular hypoechoic mass at the 8:00 position 5 cm from the nipple felt to represent the mammographic finding. US of the right axilla was negative. The patient had a biopsy of the right breast on 03/02/16 revealing grade 1-2 invasive ductal carcinoma and adenosis with calcifications (ER 90% +, PR 20% +, HER2 -, Ki67 12%). She comes today to discuss recommendations for treatment for her cancer.   PREVIOUS RADIATION THERAPY: No   PAST MEDICAL HISTORY:  Past Medical History:  Diagnosis Date  . Plantar fasciitis        PAST SURGICAL HISTORY: Past Surgical History:  Procedure Laterality Date  . CESAREAN SECTION    . FEMUR FRACTURE SURGERY Left   . TONSILLECTOMY       FAMILY HISTORY:  Family History  Problem Relation Age of Onset  . Asthma Maternal Grandfather   . Migraines Maternal Grandmother   . Allergic rhinitis Neg Hx   . Angioedema Neg Hx   . Atopy Neg Hx   . Eczema Neg Hx   . Immunodeficiency Neg Hx   . Urticaria Neg Hx      SOCIAL  HISTORY:  reports that she has been smoking E-cigarettes.  She has never used smokeless tobacco. She reports that she drinks alcohol. She reports that she does not use drugs. The patient has three children and is Married. She lives in [place]. She is retired from working for a Higher education careers adviser.   ALLERGIES: Patient has no known allergies.   MEDICATIONS:  Current Outpatient Prescriptions  Medication Sig Dispense Refill  . Biotin 1 MG CAPS Take by mouth.    Marland Kitchen BLACK COHOSH PO Take by mouth.    . calcium carbonate (OSCAL) 1500 (600 Ca) MG TABS tablet Take 600 mg of elemental calcium by mouth 2 (two) times daily with a meal.    . Fish Oil OIL by Does not apply route.    . Ibuprofen-Diphenhydramine HCl (ADVIL PM) 200-25 MG CAPS Take 1 tablet by mouth.    Marland Kitchen MAGNESIUM ASPARTATE PO Take 200 mg by mouth 2 (two) times daily.     . meloxicam (MOBIC) 15 MG tablet Take 1 tablet (15 mg total) by mouth daily. (Patient not taking: Reported on 03/11/2016) 30 tablet 3  . Multiple Vitamins-Minerals (MULTIVITAMIN & MINERAL PO) Take by mouth.    . pravastatin (PRAVACHOL) 20 MG tablet Take 20 mg by mouth daily.    . Probiotic Product (PROBIOTIC-10 PO) Take 1 tablet by mouth.    . vitamin B-12 (CYANOCOBALAMIN) 100 MCG tablet Take 3,000 mcg by mouth daily.  No current facility-administered medications for this encounter.      REVIEW OF SYSTEMS: On review of systems, the patient reports that she is doing well overall. She denies any chest pain, shortness of breath, cough, fevers, chills, night sweats, unintended weight changes. She denies any bowel or bladder disturbances, and denies abdominal pain, nausea or vomiting. She denies any new musculoskeletal or joint aches or pains. A complete review of systems is obtained and is otherwise negative.     PHYSICAL EXAM:  Wt Readings from Last 3 Encounters:  03/11/16 137 lb 8 oz (62.4 kg)  01/30/15 134 lb 11.2 oz (61.1 kg)  08/15/13 130 lb (59 kg)    Temp Readings from Last 3 Encounters:  03/11/16 97.8 F (36.6 C) (Oral)  01/30/15 97.9 F (36.6 C) (Oral)   BP Readings from Last 3 Encounters:  03/11/16 124/76  01/30/15 106/68  09/12/13 126/73   Pulse Readings from Last 3 Encounters:  03/11/16 62  01/30/15 80  09/12/13 62   In general this is a well appearing Caucasian female in no acute distress. She is alert and oriented x4 and appropriate throughout the examination. HEENT reveals that the patient is normocephalic, atraumatic. EOMs are intact. PERRLA. Skin is intact without any evidence of gross lesions. Cardiovascular exam reveals a regular rate and rhythm, no clicks rubs or murmurs are auscultated. Chest is clear to auscultation bilaterally. Lymphatic assessment is performed and does not reveal any adenopathy in the cervical, supraclavicular, axillary, or inguinal chains. Bilateral breast examination reveals no palpable masses of either breast. Post biopsy ecchymosis is noted of the right breast. No nipple bleeding or discharge is noted bilaterally. Abdomen has active bowel sounds in all quadrants and is intact. The abdomen is soft, non tender, non distended. Lower extremities are negative for pretibial pitting edema, deep calf tenderness, cyanosis or clubbing.   ECOG = 0  0 - Asymptomatic (Fully active, able to carry on all predisease activities without restriction)  1 - Symptomatic but completely ambulatory (Restricted in physically strenuous activity but ambulatory and able to carry out work of a light or sedentary nature. For example, light housework, office work)  2 - Symptomatic, <50% in bed during the day (Ambulatory and capable of all self care but unable to carry out any work activities. Up and about more than 50% of waking hours)  3 - Symptomatic, >50% in bed, but not bedbound (Capable of only limited self-care, confined to bed or chair 50% or more of waking hours)  4 - Bedbound (Completely disabled. Cannot carry on  any self-care. Totally confined to bed or chair)  5 - Death   Eustace Pen MM, Creech RH, Tormey DC, et al. [phone]). "Toxicity and response criteria of the [facility]". [place] Oncol. 5 (6): 649-55    LABORATORY DATA:  Lab Results  Component Value Date   WBC 5.0 03/11/2016   HGB 13.9 03/11/2016   HCT 41.8 03/11/2016   MCV 89.8 03/11/2016   PLT 177 03/11/2016   Lab Results  Component Value Date   NA 141 03/11/2016   K 3.9 03/11/2016   CO2 26 03/11/2016   Lab Results  Component Value Date   ALT 25 03/11/2016   AST 21 03/11/2016   ALKPHOS 60 03/11/2016   BILITOT 0.95 03/11/2016      RADIOGRAPHY: US Breast Ltd Uni Right Inc Axilla  Result Date: 02/28/2016 CLINICAL DATA:  61 year old female for evaluation of possible right breast mass on screening mammogram. EXAM:  2D DIGITAL DIAGNOSTIC RIGHT MAMMOGRAM WITH CAD AND ADJUNCT TOMO ULTRASOUND RIGHT BREAST COMPARISON:  Previous exam(s). ACR Breast Density Category b: There are scattered areas of fibroglandular density. FINDINGS: 2D and 3D full field views of both breasts demonstrate a 5 mm partially circumscribed partially obscured oval mass within the posterior outer right breast. Mammographic images were processed with CAD. On physical exam, no palpable abnormalities identified. Targeted ultrasound is performed, showing a 5 x 4 x 3 mm slightly irregular hypoechoic mass at the 8 o'clock position of the right breast 5 cm from the nipple, felt to represent the mammographic finding. No abnormal right axillary lymph nodes are identified. IMPRESSION: Suspicious 5 mm mass in the slightly lower outer right breast. Tissue sampling is recommended. No abnormal right axillary lymph nodes identified. RECOMMENDATION: Ultrasound-guided right breast biopsy, which will be arranged. I have discussed the findings and recommendations with the patient. Results were also provided in writing at the conclusion of the visit. If applicable, a  reminder letter will be sent to the patient regarding the next appointment. BI-RADS CATEGORY  4: Suspicious. Electronically Signed   By: Margarette Canada M.D.   On: 02/28/2016 11:49   Mm Diag Breast Tomo Uni Right  Result Date: 02/28/2016 CLINICAL DATA:  61 year old female for evaluation of possible right breast mass on screening mammogram. EXAM: 2D DIGITAL DIAGNOSTIC RIGHT MAMMOGRAM WITH CAD AND ADJUNCT TOMO ULTRASOUND RIGHT BREAST COMPARISON:  Previous exam(s). ACR Breast Density Category b: There are scattered areas of fibroglandular density. FINDINGS: 2D and 3D full field views of both breasts demonstrate a 5 mm partially circumscribed partially obscured oval mass within the posterior outer right breast. Mammographic images were processed with CAD. On physical exam, no palpable abnormalities identified. Targeted ultrasound is performed, showing a 5 x 4 x 3 mm slightly irregular hypoechoic mass at the 8 o'clock position of the right breast 5 cm from the nipple, felt to represent the mammographic finding. No abnormal right axillary lymph nodes are identified. IMPRESSION: Suspicious 5 mm mass in the slightly lower outer right breast. Tissue sampling is recommended. No abnormal right axillary lymph nodes identified. RECOMMENDATION: Ultrasound-guided right breast biopsy, which will be arranged. I have discussed the findings and recommendations with the patient. Results were also provided in writing at the conclusion of the visit. If applicable, a reminder letter will be sent to the patient regarding the next appointment. BI-RADS CATEGORY  4: Suspicious. Electronically Signed   By: Margarette Canada M.D.   On: 02/28/2016 11:49   Mm Clip Placement Right  Result Date: 03/02/2016 CLINICAL DATA:  Right breast mass EXAM: DIAGNOSTIC RIGHT MAMMOGRAM POST ULTRASOUND BIOPSY COMPARISON:  Previous exam(s). FINDINGS: Mammographic images were obtained following ultrasound guided biopsy of a right breast mass. The ribbon shaped biopsy  clip is within the biopsied right breast mass. IMPRESSION: Appropriate placement of biopsy clip. Final Assessment: Post Procedure Mammograms for Marker Placement Electronically Signed   By: Dorise Bullion III M.D   On: 03/02/2016 15:40   [place] Rt Breast Bx W Loc Dev 1st Lesion Img Bx Spec US Guide  Addendum Date: 03/04/2016   ADDENDUM REPORT: 03/03/2016 14:33 ADDENDUM: Pathology revealed GRADE I-II INVASIVE MAMMARY CARCINOMA, ADENOSIS WITH CALCIFICATIONS of the Right breast, 8:00 o'clock. This was found to be concordant by Dr. Dorise Bullion. Pathology results were discussed with the patient by telephone. The patient reported doing well after the biopsy with tenderness at the site. Post biopsy instructions and care were reviewed and questions were answered. The patient  was encouraged to call [facility] for any additional concerns. The patient was referred to [facility] on March 11, 2016. Pathology results reported by Terie Purser, RN on 03/03/2016. Electronically Signed   By: Dorise Bullion III M.D   On: 03/03/2016 14:33   Result Date: 03/04/2016 CLINICAL DATA:  Right breast mass EXAM: ULTRASOUND GUIDED RIGHT BREAST CORE NEEDLE BIOPSY COMPARISON:  Previous exam(s). FINDINGS: I met with the patient and we discussed the procedure of ultrasound-guided biopsy, including benefits and alternatives. We discussed the high likelihood of a successful procedure. We discussed the risks of the procedure, including infection, bleeding, tissue injury, clip migration, and inadequate sampling. Informed written consent was given. The usual time-out protocol was performed immediately prior to the procedure. Using sterile technique and 1% Lidocaine as local anesthetic, under direct ultrasound visualization, a 12 gauge spring-loaded device was used to perform biopsy of a right breast mass using a lateral approach. At the  conclusion of the procedure a tissue marker clip was deployed into the biopsy cavity. Follow up 2 view mammogram was performed and dictated separately. IMPRESSION: Ultrasound guided biopsy of a right breast mass. No apparent complications. Electronically Signed: By: Dorise Bullion III M.D On: 03/02/2016 15:29       IMPRESSION/PLAN: 1. Stage IA cT1aN0, ER/PR positive, grade 1-2 invasive ductal carcinoma of the right breast. Dr. Lisbeth Renshaw discussed the pathology findings and reviewed the nature of invasive ductal carcinoma of the right breast. The consensus from the breast conference include breast conservation with lumpectomy and sentinel mapping. If her tumor on final pathology was greater than 10 mm, additional onctype testing would be performed, and provided that chemotherapy is not indicated, the patient's course would then be followed by external radiotherapy to the breast followed by antiestrogen therapy. We discussed the risks, benefits, short, and long term effects of radiotherapy, and the patient is interested in proceeding. Dr. Lisbeth Renshaw discussed the delivery and logistics of radiotherapy. We will see her back about 2 weeks after surgery to move forward with the simulation and planning process and anticipate starting radiotherapy about 4 weeks after surgery.    The above documentation reflects my direct findings during this shared patient visit. Please see the separate note by Dr. Lisbeth Renshaw on this date for the remainder of the patient's plan of care.    Carola Rhine, PAC  This document serves as a record of services personally performed by Shona Simpson, PA-C and Kyung Rudd, MD. It was created on their behalf by Darcus Austin, a trained medical scribe. The creation of this record is based on the scribe's personal observations and the providers' statements to them. This document has been checked and approved by the attending provider.

## 2016-03-12 ENCOUNTER — Encounter: Payer: Self-pay | Admitting: General Practice

## 2016-03-12 NOTE — Progress Notes (Signed)
[place] Psychosocial Distress Screening Spiritual Care  Spoke with [facility] by phone following Breast Multidisciplinary Clinic to introduce [facility] team/resources, reviewing distress screen per protocol.  The patient scored a 3 on the Psychosocial Distress Thermometer which indicates mild distress. Also assessed for distress and other psychosocial needs.   ONCBCN DISTRESS SCREENING 03/12/2016  Distress experienced in past week (1-10) 2  Emotional problem type Adjusting to illness  Information Concerns Type Lack of info about diagnosis;Lack of info about treatment;Lack of info about complementary therapy choices  Physical Problem type Sleep/insomnia  Referral to support programs Yes   Ms Tollis was in good spirits today, citing relief at news she learned at [facility] yesterday.  She describes herself as "optimistic" and states, "I love my team." Per pt, she is ready to take action and "get this behind me!"  She reports good support from her daughter.  Per pt, no other concerns at this time.  Provided emotional support and empathic listening, normalizing feelings (including changes/developments over time) and encouraging pt to reach out anytime and to consider support programming in the future.  Follow up needed: No.  Will send full packet of [place] by mail, including handwritten note of encouragement.  Please also page if immediate needs arise.  Thank you.   [place], [place], [facility] Pager [phone] Voicemail [phone]

## 2016-03-16 ENCOUNTER — Telehealth: Payer: Self-pay | Admitting: *Deleted

## 2016-03-16 NOTE — Telephone Encounter (Signed)
  Oncology Nurse Navigator Documentation  Navigator Location: [facility] (03/16/16 1400)   )Navigator Encounter Type: Telephone (03/16/16 1400) Telephone: Outgoing Call;Clinic/[facility] Follow-up (03/16/16 1400)                                                  Time Spent with Patient: 15 (03/16/16 1400)

## 2016-03-17 ENCOUNTER — Other Ambulatory Visit: Payer: Self-pay | Admitting: Surgery

## 2016-03-17 DIAGNOSIS — C50411 Malignant neoplasm of upper-outer quadrant of right female breast: Secondary | ICD-10-CM

## 2016-03-17 DIAGNOSIS — Z170 Estrogen receptor positive status [ER+]: Secondary | ICD-10-CM

## 2016-03-20 ENCOUNTER — Encounter: Payer: Self-pay | Admitting: Radiation Oncology

## 2016-03-23 ENCOUNTER — Telehealth: Payer: Self-pay | Admitting: Hematology and Oncology

## 2016-03-23 NOTE — Telephone Encounter (Signed)
lvm to inform pt of 3/15 appt per LOS °

## 2016-03-25 ENCOUNTER — Encounter (HOSPITAL_BASED_OUTPATIENT_CLINIC_OR_DEPARTMENT_OTHER): Payer: Self-pay | Admitting: *Deleted

## 2016-03-25 NOTE — Progress Notes (Signed)
Bring all medications. To pick up Boost on Tuesday.

## 2016-03-31 ENCOUNTER — Ambulatory Visit
Admission: RE | Admit: 2016-03-31 | Discharge: 2016-03-31 | Disposition: A | Discharge status: Home / Self Care | Payer: BLUE CROSS/BLUE SHIELD | Source: Ambulatory Visit | Attending: Surgery | Admitting: Surgery

## 2016-03-31 DIAGNOSIS — Z170 Estrogen receptor positive status [ER+]: Secondary | ICD-10-CM

## 2016-03-31 DIAGNOSIS — R928 Other abnormal and inconclusive findings on diagnostic imaging of breast: Secondary | ICD-10-CM | POA: Diagnosis not present

## 2016-03-31 DIAGNOSIS — C50411 Malignant neoplasm of upper-outer quadrant of right female breast: Secondary | ICD-10-CM

## 2016-03-31 NOTE — Progress Notes (Signed)
Boost drink given with instructions to complete by 0600 dos, pt verbalized understanding. 

## 2016-04-02 ENCOUNTER — Encounter (HOSPITAL_BASED_OUTPATIENT_CLINIC_OR_DEPARTMENT_OTHER): Payer: Self-pay | Admitting: *Deleted

## 2016-04-02 ENCOUNTER — Encounter (HOSPITAL_COMMUNITY)
Admission: RE | Admit: 2016-04-02 | Discharge: 2016-04-02 | Disposition: A | Discharge status: Home / Self Care | Payer: BLUE CROSS/BLUE SHIELD | Source: Ambulatory Visit | Attending: Surgery | Admitting: Surgery

## 2016-04-02 ENCOUNTER — Ambulatory Visit (HOSPITAL_BASED_OUTPATIENT_CLINIC_OR_DEPARTMENT_OTHER): Payer: BLUE CROSS/BLUE SHIELD | Admitting: Anesthesiology

## 2016-04-02 ENCOUNTER — Encounter (HOSPITAL_BASED_OUTPATIENT_CLINIC_OR_DEPARTMENT_OTHER)
Admission: RE | Disposition: A | Discharge status: Home / Self Care | Payer: Self-pay | Source: Ambulatory Visit | Attending: Surgery

## 2016-04-02 ENCOUNTER — Ambulatory Visit (HOSPITAL_BASED_OUTPATIENT_CLINIC_OR_DEPARTMENT_OTHER)
Admission: RE | Admit: 2016-04-02 | Discharge: 2016-04-02 | Disposition: A | Discharge status: Home / Self Care | Payer: BLUE CROSS/BLUE SHIELD | Source: Ambulatory Visit | Attending: Surgery | Admitting: Surgery

## 2016-04-02 ENCOUNTER — Ambulatory Visit
Admission: RE | Admit: 2016-04-02 | Discharge: 2016-04-02 | Disposition: A | Discharge status: Home / Self Care | Payer: BLUE CROSS/BLUE SHIELD | Source: Ambulatory Visit | Attending: Surgery | Admitting: Surgery

## 2016-04-02 DIAGNOSIS — J302 Other seasonal allergic rhinitis: Secondary | ICD-10-CM | POA: Diagnosis not present

## 2016-04-02 DIAGNOSIS — R109 Unspecified abdominal pain: Secondary | ICD-10-CM | POA: Diagnosis not present

## 2016-04-02 DIAGNOSIS — Z820 Family history of epilepsy and other diseases of the nervous system: Secondary | ICD-10-CM | POA: Insufficient documentation

## 2016-04-02 DIAGNOSIS — Z87891 Personal history of nicotine dependence: Secondary | ICD-10-CM | POA: Diagnosis not present

## 2016-04-02 DIAGNOSIS — R928 Other abnormal and inconclusive findings on diagnostic imaging of breast: Secondary | ICD-10-CM | POA: Diagnosis not present

## 2016-04-02 DIAGNOSIS — Z8249 Family history of ischemic heart disease and other diseases of the circulatory system: Secondary | ICD-10-CM | POA: Insufficient documentation

## 2016-04-02 DIAGNOSIS — Z808 Family history of malignant neoplasm of other organs or systems: Secondary | ICD-10-CM | POA: Diagnosis not present

## 2016-04-02 DIAGNOSIS — N644 Mastodynia: Secondary | ICD-10-CM | POA: Diagnosis not present

## 2016-04-02 DIAGNOSIS — C50911 Malignant neoplasm of unspecified site of right female breast: Secondary | ICD-10-CM | POA: Diagnosis not present

## 2016-04-02 DIAGNOSIS — M549 Dorsalgia, unspecified: Secondary | ICD-10-CM | POA: Diagnosis not present

## 2016-04-02 DIAGNOSIS — C50411 Malignant neoplasm of upper-outer quadrant of right female breast: Secondary | ICD-10-CM

## 2016-04-02 DIAGNOSIS — Z8042 Family history of malignant neoplasm of prostate: Secondary | ICD-10-CM | POA: Insufficient documentation

## 2016-04-02 DIAGNOSIS — Z823 Family history of stroke: Secondary | ICD-10-CM | POA: Diagnosis not present

## 2016-04-02 DIAGNOSIS — Z170 Estrogen receptor positive status [ER+]: Secondary | ICD-10-CM

## 2016-04-02 DIAGNOSIS — E7800 Pure hypercholesterolemia, unspecified: Secondary | ICD-10-CM | POA: Insufficient documentation

## 2016-04-02 DIAGNOSIS — C50511 Malignant neoplasm of lower-outer quadrant of right female breast: Secondary | ICD-10-CM | POA: Insufficient documentation

## 2016-04-02 DIAGNOSIS — Z803 Family history of malignant neoplasm of breast: Secondary | ICD-10-CM | POA: Insufficient documentation

## 2016-04-02 DIAGNOSIS — G8918 Other acute postprocedural pain: Secondary | ICD-10-CM | POA: Diagnosis not present

## 2016-04-02 HISTORY — PX: BREAST LUMPECTOMY: SHX2

## 2016-04-02 HISTORY — PX: RADIOACTIVE SEED GUIDED PARTIAL MASTECTOMY WITH AXILLARY SENTINEL LYMPH NODE BIOPSY: SHX6520

## 2016-04-02 SURGERY — RADIOACTIVE SEED GUIDED PARTIAL MASTECTOMY WITH AXILLARY SENTINEL LYMPH NODE BIOPSY
Anesthesia: General | Site: Breast | Laterality: Right

## 2016-04-02 MED ORDER — LACTATED RINGERS IV SOLN
INTRAVENOUS | Status: DC
  Administered 2016-04-02: 09:00:00 via INTRAVENOUS

## 2016-04-02 MED ORDER — CEFAZOLIN SODIUM-DEXTROSE 2-4 GM/100ML-% IV SOLN
INTRAVENOUS | Status: AC
Start: 2016-04-02 — End: 2016-04-02
  Filled 2016-04-02: qty 100

## 2016-04-02 MED ORDER — ONDANSETRON HCL 4 MG/2ML IJ SOLN
INTRAMUSCULAR | Status: AC
  Filled 2016-04-02: qty 2

## 2016-04-02 MED ORDER — LIDOCAINE 2% (20 MG/ML) 5 ML SYRINGE
INTRAMUSCULAR | Status: AC
  Filled 2016-04-02: qty 5

## 2016-04-02 MED ORDER — LIDOCAINE 2% (20 MG/ML) 5 ML SYRINGE
INTRAMUSCULAR | Status: DC | PRN
  Administered 2016-04-02: 100 mg via INTRAVENOUS

## 2016-04-02 MED ORDER — GABAPENTIN 300 MG PO CAPS
300.0000 mg | ORAL_CAPSULE | ORAL | Status: AC
  Administered 2016-04-02: 300 mg via ORAL

## 2016-04-02 MED ORDER — ACETAMINOPHEN 500 MG PO TABS
ORAL_TABLET | ORAL | Status: AC
  Filled 2016-04-02: qty 2

## 2016-04-02 MED ORDER — PROPOFOL 10 MG/ML IV BOLUS
INTRAVENOUS | Status: DC | PRN
  Administered 2016-04-02: 150 mg via INTRAVENOUS

## 2016-04-02 MED ORDER — OXYCODONE-ACETAMINOPHEN 5-325 MG PO TABS: 1.0000 | ORAL_TABLET | ORAL | 0 refills | Status: DC | PRN

## 2016-04-02 MED ORDER — FENTANYL CITRATE (PF) 100 MCG/2ML IJ SOLN
INTRAMUSCULAR | Status: AC
  Filled 2016-04-02: qty 2

## 2016-04-02 MED ORDER — ONDANSETRON HCL 4 MG/2ML IJ SOLN
INTRAMUSCULAR | Status: DC | PRN
  Administered 2016-04-02: 4 mg via INTRAVENOUS

## 2016-04-02 MED ORDER — CELECOXIB 400 MG PO CAPS
400.0000 mg | ORAL_CAPSULE | ORAL | Status: AC
  Administered 2016-04-02: 400 mg via ORAL

## 2016-04-02 MED ORDER — EPHEDRINE 5 MG/ML INJ
INTRAVENOUS | Status: AC
  Filled 2016-04-02: qty 10

## 2016-04-02 MED ORDER — CELECOXIB 200 MG PO CAPS
ORAL_CAPSULE | ORAL | Status: AC
  Filled 2016-04-02: qty 2

## 2016-04-02 MED ORDER — SCOPOLAMINE 1 MG/3DAYS TD PT72: 1.0000 | MEDICATED_PATCH | Freq: Once | TRANSDERMAL | Status: DC | PRN

## 2016-04-02 MED ORDER — 0.9 % SODIUM CHLORIDE (POUR BTL) OPTIME
TOPICAL | Status: DC | PRN
  Administered 2016-04-02: 200 mL

## 2016-04-02 MED ORDER — MEPERIDINE HCL 25 MG/ML IJ SOLN: 6.2500 mg | INTRAMUSCULAR | Status: DC | PRN

## 2016-04-02 MED ORDER — BUPIVACAINE-EPINEPHRINE (PF) 0.5% -1:200000 IJ SOLN
INTRAMUSCULAR | Status: DC | PRN
  Administered 2016-04-02: 10 mL

## 2016-04-02 MED ORDER — TECHNETIUM TC 99M SULFUR COLLOID FILTERED
1.0000 | Freq: Once | INTRAVENOUS | Status: AC | PRN
  Administered 2016-04-02: 1 via INTRADERMAL

## 2016-04-02 MED ORDER — DEXTROSE 5 % IV SOLN: 3.0000 g | INTRAVENOUS | Status: DC

## 2016-04-02 MED ORDER — FENTANYL CITRATE (PF) 100 MCG/2ML IJ SOLN
25.0000 ug | INTRAMUSCULAR | Status: DC | PRN
Start: 2016-04-02 — End: 2016-04-02
  Administered 2016-04-02: 25 ug via INTRAVENOUS
  Administered 2016-04-02: 50 ug via INTRAVENOUS

## 2016-04-02 MED ORDER — HYDROCODONE-ACETAMINOPHEN 7.5-325 MG PO TABS: 1.0000 | ORAL_TABLET | Freq: Once | ORAL | Status: DC | PRN

## 2016-04-02 MED ORDER — DEXAMETHASONE SODIUM PHOSPHATE 10 MG/ML IJ SOLN
INTRAMUSCULAR | Status: AC
  Filled 2016-04-02: qty 1

## 2016-04-02 MED ORDER — FENTANYL CITRATE (PF) 100 MCG/2ML IJ SOLN
50.0000 ug | INTRAMUSCULAR | Status: DC | PRN
  Administered 2016-04-02: 50 ug via INTRAVENOUS

## 2016-04-02 MED ORDER — DEXAMETHASONE SODIUM PHOSPHATE 4 MG/ML IJ SOLN
INTRAMUSCULAR | Status: DC | PRN
  Administered 2016-04-02: 10 mg via INTRAVENOUS

## 2016-04-02 MED ORDER — EPHEDRINE SULFATE 50 MG/ML IJ SOLN
INTRAMUSCULAR | Status: DC | PRN
  Administered 2016-04-02 (×2): 10 mg via INTRAVENOUS

## 2016-04-02 MED ORDER — ACETAMINOPHEN 500 MG PO TABS
1000.0000 mg | ORAL_TABLET | ORAL | Status: AC
  Administered 2016-04-02: 1000 mg via ORAL

## 2016-04-02 MED ORDER — MIDAZOLAM HCL 2 MG/2ML IJ SOLN
1.0000 mg | INTRAMUSCULAR | Status: DC | PRN
  Administered 2016-04-02: 1 mg via INTRAVENOUS

## 2016-04-02 MED ORDER — CHLORHEXIDINE GLUCONATE CLOTH 2 % EX PADS: 6.0000 | MEDICATED_PAD | Freq: Once | CUTANEOUS | Status: DC

## 2016-04-02 MED ORDER — METOCLOPRAMIDE HCL 5 MG/ML IJ SOLN: 10.0000 mg | Freq: Once | INTRAMUSCULAR | Status: DC | PRN

## 2016-04-02 MED ORDER — MIDAZOLAM HCL 2 MG/2ML IJ SOLN
INTRAMUSCULAR | Status: AC
  Filled 2016-04-02: qty 2

## 2016-04-02 MED ORDER — GABAPENTIN 300 MG PO CAPS
ORAL_CAPSULE | ORAL | Status: AC
Start: 2016-04-02 — End: 2016-04-02
  Filled 2016-04-02: qty 1

## 2016-04-02 SURGICAL SUPPLY — 47 items
ADH SKN CLS APL DERMABOND .7 (GAUZE/BANDAGES/DRESSINGS) ×1
APPLIER CLIP 9.375 MED OPEN (MISCELLANEOUS) ×2
APR CLP MED 9.3 20 MLT OPN (MISCELLANEOUS) ×1
BINDER BREAST MEDIUM (GAUZE/BANDAGES/DRESSINGS) ×2 IMPLANT
BLADE SURG 15 STRL LF DISP TIS (BLADE) ×1 IMPLANT
BLADE SURG 15 STRL SS (BLADE) ×2
CANISTER SUC SOCK COL 7IN (MISCELLANEOUS) IMPLANT
CANISTER SUCT 1200ML W/VALVE (MISCELLANEOUS) ×2 IMPLANT
CHLORAPREP W/TINT 26ML (MISCELLANEOUS) ×2 IMPLANT
CLIP APPLIE 9.375 MED OPEN (MISCELLANEOUS) ×1 IMPLANT
COVER BACK TABLE 60X90IN (DRAPES) ×2 IMPLANT
COVER MAYO STAND STRL (DRAPES) ×2 IMPLANT
COVER PROBE W GEL 5X96 (DRAPES) ×2 IMPLANT
DECANTER SPIKE VIAL GLASS SM (MISCELLANEOUS) IMPLANT
DERMABOND ADVANCED (GAUZE/BANDAGES/DRESSINGS) ×1
DERMABOND ADVANCED .7 DNX12 (GAUZE/BANDAGES/DRESSINGS) ×1 IMPLANT
DEVICE DUBIN W/COMP PLATE 8390 (MISCELLANEOUS) ×2 IMPLANT
DRAPE LAPAROSCOPIC ABDOMINAL (DRAPES) ×2 IMPLANT
DRAPE UTILITY XL STRL (DRAPES) ×2 IMPLANT
ELECT COATED BLADE 2.86 ST (ELECTRODE) ×2 IMPLANT
ELECT REM PT RETURN 9FT ADLT (ELECTROSURGICAL) ×2
ELECTRODE REM PT RTRN 9FT ADLT (ELECTROSURGICAL) ×1 IMPLANT
GLOVE BIO SURGEON STRL SZ 6.5 (GLOVE) ×2 IMPLANT
GLOVE BIOGEL PI IND STRL 7.0 (GLOVE) ×1 IMPLANT
GLOVE BIOGEL PI IND STRL 8 (GLOVE) ×1 IMPLANT
GLOVE BIOGEL PI INDICATOR 7.0 (GLOVE) ×1
GLOVE BIOGEL PI INDICATOR 8 (GLOVE) ×1
GLOVE ECLIPSE 8.0 STRL XLNG CF (GLOVE) ×2 IMPLANT
GOWN STRL REUS W/ TWL LRG LVL3 (GOWN DISPOSABLE) ×2 IMPLANT
GOWN STRL REUS W/TWL LRG LVL3 (GOWN DISPOSABLE) ×4
HEMOSTAT SNOW SURGICEL 2X4 (HEMOSTASIS) IMPLANT
KIT MARKER MARGIN INK (KITS) ×2 IMPLANT
NDL SAFETY ECLIPSE 18X1.5 (NEEDLE) IMPLANT
NEEDLE HYPO 18GX1.5 SHARP (NEEDLE)
NEEDLE HYPO 25X1 1.5 SAFETY (NEEDLE) ×2 IMPLANT
NS IRRIG 1000ML POUR BTL (IV SOLUTION) ×2 IMPLANT
PACK BASIN DAY SURGERY FS (CUSTOM PROCEDURE TRAY) ×2 IMPLANT
PENCIL BUTTON HOLSTER BLD 10FT (ELECTRODE) ×2 IMPLANT
SLEEVE SCD COMPRESS KNEE MED (MISCELLANEOUS) ×2 IMPLANT
SPONGE LAP 4X18 X RAY DECT (DISPOSABLE) ×2 IMPLANT
SUT MNCRL AB 4-0 PS2 18 (SUTURE) ×2 IMPLANT
SUT VICRYL 3-0 CR8 SH (SUTURE) ×2 IMPLANT
SYR CONTROL 10ML LL (SYRINGE) ×2 IMPLANT
TOWEL OR 17X24 6PK STRL BLUE (TOWEL DISPOSABLE) ×2 IMPLANT
TOWEL OR NON WOVEN STRL DISP B (DISPOSABLE) ×2 IMPLANT
TUBE CONNECTING 20X1/4 (TUBING) ×2 IMPLANT
YANKAUER SUCT BULB TIP NO VENT (SUCTIONS) ×2 IMPLANT

## 2016-04-02 NOTE — Op Note (Signed)
Preoperative diagnosis: Stage I right breast cancer lower outer quadrant  Postoperative diagnosis: Same  Procedure: Right breast seed localized partial mastectomy with right axillary deep sentinel lymph node mapping  Surgeon: Erroll Luna M.D.  Anesthesia: LMA with regional block and local anesthetic  EBL: 10 mL  Specimen: Right breast tissue with seed and clip verified by radiograph in one axillary sentinel node  Drains: None  Indications for procedure: The patient is a 61 year old be female with stage I right breast cancer. She was evaluated the multidisciplinary clinic and chosen breast conservation. She also met with medical and radiation oncology. She presents today for right breast partial mastectomy with right axillary sentinel lymph node mapping.The procedure has been discussed with the patient. Alternatives to surgery have been discussed with the patient.  Risks of surgery include bleeding,  Infection,  Seroma formation, death,  and the need for further surgery.   The patient understands and wishes to proceed.Sentinel lymph node mapping and dissection has been discussed with the patient.  Risk of bleeding,  Infection,  Seroma formation,  Additional procedures,,  Shoulder weakness ,  Shoulder stiffness,  Nerve and blood vessel injury and reaction to the mapping dyes have been discussed.  Alternatives to surgery have been discussed with the patient.  The patient agrees to proceed.    Description of procedure: The patient was met in the holding area. She underwent seed placement by radiology as an outpatient. She underwent injection of the right breast with technetium sulfur colloid. She had a regional block placed by anesthesia on the right. Questions were answered. The right side was marked as to correct side. She was taken to the operating room and placed supine on the OR table. After induction of LMA anesthesia, the right breast was prepped and draped in a sterile fashion. Timeout was  done. She received appropriate preoperative antibiotics. The neoprobe was used the seed was identified in the right breast. A right inframammary incision was made near the lesion. All tissue around the lesion was excised to include the clip and seed. An additional anterior margin was taken as well. All this was oriented and sent to pathology. Radiograph revealed the cecum clip to be in the specimen. We was made hemostatic and irrigated and closed with 3-0 Vicryl and 4-0 Monocryl.  The neoprobe settings were changed to technetium. Hotspot identified in the right axilla. 3 cm incision made in the right axilla and dissection carried down into the deep axillary contents. 1 hot sentinel node remove him background counts approached 0. Hemostasis achieved. The balloon was closed with 3-0 Vicryl and 4-0 Monocryl. Liquid adhesive applied to incisions as well as breast binder. All final counts were found to be correct. The patient was awoke taken recovery extubated in satisfactory condition.

## 2016-04-02 NOTE — Anesthesia Preprocedure Evaluation (Signed)
Anesthesia Evaluation  Patient identified by MRN, date of birth, ID band Patient awake    Reviewed: Allergy & Precautions, NPO status , Patient's Chart, lab work & pertinent test results  Airway Mallampati: II  TM Distance: >3 FB Neck ROM: Full    Dental no notable dental hx. (+) Teeth Intact   Pulmonary Current Smoker,    Pulmonary exam normal breath sounds clear to auscultation       Cardiovascular negative cardio ROS Normal cardiovascular exam Rhythm:Regular Rate:Normal     Neuro/Psych negative neurological ROS  negative psych ROS   GI/Hepatic negative GI ROS, Neg liver ROS,   Endo/Other  Right Breast Ca  Renal/GU negative Renal ROS  negative genitourinary   Musculoskeletal negative musculoskeletal ROS (+)   Abdominal   Peds  Hematology negative hematology ROS (+)   Anesthesia Other Findings   Reproductive/Obstetrics                             Anesthesia Physical Anesthesia Plan  ASA: II  Anesthesia Plan: General and Regional   Post-op Pain Management:  Regional for Post-op pain   Induction: Intravenous  Airway Management Planned: LMA  Additional Equipment:   Intra-op Plan:   Post-operative Plan: Extubation in OR  Informed Consent: I have reviewed the patients History and Physical, chart, labs and discussed the procedure including the risks, benefits and alternatives for the proposed anesthesia with the patient or authorized representative who has indicated his/her understanding and acceptance.   Dental advisory given  Plan Discussed with: CRNA, Anesthesiologist and Surgeon  Anesthesia Plan Comments:         Anesthesia Quick Evaluation

## 2016-04-02 NOTE — Anesthesia Procedure Notes (Signed)
Procedure Name: LMA Insertion Date/Time: 04/02/2016 10:37 AM Performed by: Lieutenant Diego Pre-anesthesia Checklist: Patient identified, Emergency Drugs available, Suction available and Patient being monitored Patient Re-evaluated:Patient Re-evaluated prior to inductionOxygen Delivery Method: Circle system utilized Preoxygenation: Pre-oxygenation with 100% oxygen Intubation Type: IV induction Ventilation: Mask ventilation without difficulty LMA: LMA inserted LMA Size: 4.0 Number of attempts: 1 Airway Equipment and Method: Bite block Placement Confirmation: positive ETCO2 and breath sounds checked- equal and bilateral Tube secured with: Tape Dental Injury: Teeth and Oropharynx as per pre-operative assessment

## 2016-04-02 NOTE — Progress Notes (Signed)
Assisted Dr. Foster with right, ultrasound guided, pectoralis block. Side rails up, monitors on throughout procedure. See vital signs in flow sheet. Tolerated Procedure well. 

## 2016-04-02 NOTE — Anesthesia Postprocedure Evaluation (Signed)
Anesthesia Post Note  Patient: Teresa Mitchell  Procedure(s) Performed: Procedure(s) (LRB): RADIOACTIVE SEED GUIDED PARTIAL MASTECTOMY WITH AXILLARY SENTINEL LYMPH NODE BIOPSY (Right)  Patient location during evaluation: PACU Anesthesia Type: General Level of consciousness: awake and alert and oriented Pain management: pain level controlled Vital Signs Assessment: post-procedure vital signs reviewed and stable Respiratory status: spontaneous breathing, nonlabored ventilation and respiratory function stable Cardiovascular status: blood pressure returned to baseline and stable Postop Assessment: no signs of nausea or vomiting Anesthetic complications: no       Last Vitals:  Vitals:   04/02/16 1200 04/02/16 1216  BP: 115/70 114/70  Pulse: 98 90  Resp: 14 18  Temp:  36.3 C    Last Pain:  Vitals:   04/02/16 1216  TempSrc: Oral  PainSc: 3                  Ajmal Kathan A.

## 2016-04-02 NOTE — Transfer of Care (Signed)
Immediate Anesthesia Transfer of Care Note  Patient: Teresa Mitchell  Procedure(s) Performed: Procedure(s): RADIOACTIVE SEED GUIDED PARTIAL MASTECTOMY WITH AXILLARY SENTINEL LYMPH NODE BIOPSY (Right)  Patient Location: PACU  Anesthesia Type:GA combined with regional for post-op pain  Level of Consciousness: sedated  Airway & Oxygen Therapy: Patient Spontanous Breathing and Patient connected to face mask oxygen  Post-op Assessment: Report given to RN and Post -op Vital signs reviewed and stable  Post vital signs: Reviewed and stable  Last Vitals:  Vitals:   04/02/16 1015 04/02/16 1133  BP: (!) 101/50   Pulse: (!) 58 80  Resp: 19 15  Temp:      Last Pain:  Vitals:   04/02/16 0859  TempSrc: Oral         Complications: No apparent anesthesia complications

## 2016-04-02 NOTE — Progress Notes (Signed)
Nuc med staff performed by nuc med staff. Pt tol well with no additional sedation required. Will retrieve family from lobby and update/provide emotional support

## 2016-04-02 NOTE — H&P (View-Only) (Signed)
Nabiha E Miramontes 03/11/2016 7:35 AM Location: [facility] Patient #: [id number] DOB: 06/06/1955 Undefined / Language: English / Race: White Female  History of Present Illness (Alanah Sakuma A. Willy Pinkerton MD; 03/11/2016 2:32 PM) Patient words: patient sent at the request of Dr. Mood for right breast cancer. This was picked up on the screening mammogram. She was found to have a0.5 cm mass upper outer quadrant right breast ER positive PR positive HER-2/neu negative with a Ki-67 12%. Patient denies any history of breast . pain nipple discharge problem with either breast.       ADDITIONAL INFORMATION: PROGNOSTIC INDICATORS Results: IMMUNOHISTOCHEMICAL AND MORPHOMETRIC ANALYSIS PERFORMED MANUALLY Estrogen Receptor: 90%, POSITIVE, STRONG STAINING INTENSITY Progesterone Receptor: 20%, POSITIVE, STRONG STAINING INTENSITY Proliferation Marker Ki67: 12% REFERENCE RANGE ESTROGEN RECEPTOR NEGATIVE 0% POSITIVE =>1% REFERENCE RANGE PROGESTERONE RECEPTOR NEGATIVE 0% POSITIVE =>1% All controls stained appropriately JOSHUA KISH MD Pathologist, Electronic Signature ( Signed 03/05/2016) FLUORESCENCE IN-SITU HYBRIDIZATION Results: HER2 - NEGATIVE RATIO OF HER2/CEP17 SIGNALS 1.19 AVERAGE HER2 COPY NUMBER PER CELL 2.50 Reference Range: NEGATIVE HER2/CEP17 Ratio <2.0 and average HER2 copy number <4.0 EQUIVOCAL HER2/CEP17 Ratio <2.0 and average HER2 copy number >=4.0 and <6.0 1 of 3 FINAL for Artz, Adrie E ([id number]) ADDITIONAL INFORMATION:(continued) POSITIVE HER2/CEP17 Ratio >=2.0 or <2.0 and average HER2 copy number >=6.0 JOSHUA KISH MD Pathologist, Electronic Signature ( Signed 03/05/2016) The malignant cells are positive for E-Cadherin, supporting a ductal phenotype. (JBK:gt, 03/03/16) JOSHUA KISH MD Pathologist, Electronic Signature ( Signed 03/03/2016) FINAL DIAGNOSIS Diagnosis Breast, right, needle core biopsy, 8:00 o'clock - INVASIVE MAMMARY CARCINOMA. - ADENOSIS WITH  CALCIFICATIONS. - SEE COMMENT. Microscopic Comment The carcinoma appears grade 1-2. An E-Cadherin stain will be performed and the results reported separately. A breast prognostic profile will be performed and the results reported separately. The results were called to [facility] on 03/03/16. (JBK:gt, 03/03/16) JOSHUA KISH MD Pathologist, Electronic Signature (Case signed 03/03/2016) Specimen Gross and Clinical Information Specimen Comment In formalin 3:20 extracted less than 5 min; right breast mass Specimen(s) Obtained: Breast, right, needle core biopsy, 8:00 o'clock Specimen Clinical Information Right breast mass Gross Received labeled "Kloc,Audie" and "Right breast 8:00" (TIF 1520 CIT <5min) are 4 cores of yellow to gray white soft to firm tissue, ranging from 0.8 x 0.1 x 0.1 cm to 1 x 0.1 x 0.1 cm. One block submitted. (SSW 2/5) 2 of 3 FINAL for Capraro, Bernestine E ([id number]) Stain(s) used in Diagnosis: The following stain(s) were used in diagnosing the case: Her2 FISH, PR-ACIS, KI-67-ACIS, ER-ACIS, E-CAD. The control(s) stained appropriately. Disclaimer Estrogen receptor (6F11), immunohistochemical stains are performed on formalin fixed, paraffin embedded tissue using a 3,3"-diaminobenzidine (DAB) chromogen and Leica Bond Autostainer System. The staining intensity of the nucleus is scored manually and is reported as the percentage of tumor cell nuclei demonstrating specific nuclear staining. Ki-67 (MM1), immunohistochemical stains are performed on formalin fixed, paraffin embedded tissue using a 3,3"-diaminobenzidine (DAB) chromogen and Leica Bond Autostainer System. The staining intensity of the nucleus is scored manually and is reported as the percentage of tumor cell nuclei demonstrating specific nuclear staining. HER2 IQFISH pharmDX (code K5731) is a direct fluorescence in-situ hybridization assay designed to quantitatively determine HER2 gene amplification  in formalin-fixed, paraffin-embedded tissue specimens. It is performed at [facility] and is reported using ASCO/CAP scoring criteria published in 2013. Some of these immunohistochemical stains may have been developed and the performance characteristics determined by [facility]. Some may not have been cleared or approved by the   U.S. Food and Drug Administration. The FDA has determined that such clearance or approval is not necessary. This test is used for clinical purposes. It should not be regarded as investigational or for research. This laboratory is certified under the Clinical Laboratory Improvement Amendments of 1988 (CLIA-88) as qualified to perform high complexity clinical laboratory testing. PR progesterone receptor (16), immunohistochemical stains are performed on formalin fixed, paraffin embedded tissue using a 3,3"-diaminobenzidine (DAB) chromogen and Leica Bond Autostainer System. The staining intensity of the nucleus is scored manually and is reported as the percentage of tumor cell nuclei demonstrating specific nuclear staining. Report signed out from the following location(s) Technical Component was performed at [facility] [address],[place] [address].CLIA:[id number],CAP:[id number]., Technical component and interpretation was performed at [place] [address], [place], [place] [address]. CLIA #: [id number], 3 of         CLINICAL DATA: 60-year-old female for evaluation of possible right breast mass on screening mammogram. EXAM: 2D DIGITAL DIAGNOSTIC RIGHT MAMMOGRAM WITH CAD AND ADJUNCT TOMO ULTRASOUND RIGHT BREAST COMPARISON: Previous exam(s). ACR Breast Density Category b: There are scattered areas of fibroglandular density. FINDINGS: 2D and 3D full field views of both breasts demonstrate a 5 mm partially circumscribed partially obscured oval mass within the posterior outer right breast.  Mammographic images were processed with CAD. On physical exam, no palpable abnormalities identified. Targeted ultrasound is performed, showing a 5 x 4 x 3 mm slightly irregular hypoechoic mass at the 8 o'clock position of the right breast 5 cm from the nipple, felt to represent the mammographic finding. No abnormal right axillary lymph nodes are identified. IMPRESSION: Suspicious 5 mm mass in the slightly lower outer right breast. Tissue sampling is recommended. No abnormal right axillary lymph nodes identified. RECOMMENDATION: Ultrasound-guided right breast biopsy, which will be arranged. I have discussed the findings and recommendations with the patient. Results were also provided in writing at the conclusion of the visit. If applicable, a reminder letter will be sent to the patient regarding the next appointment. BI-RADS CATEGORY 4: Suspicious. Electronically Signed By: Jeffrey Hu M.D. On: 02/28/2016 11:49 .  The patient is a 61 year old female.   Past Surgical History (Sylvia Ledford, RN; 03/11/2016 7:35 AM) Breast Biopsy Right. Cesarean Section - 1 Tonsillectomy  Diagnostic Studies History (Sylvia Ledford, RN; 03/11/2016 7:35 AM) Colonoscopy within last year Mammogram within last year Pap Smear 1-5 years ago  Medication History (Sylvia Ledford, RN; 03/11/2016 7:35 AM) Medications Reconciled  Social History (Sylvia Ledford, RN; 03/11/2016 7:35 AM) Alcohol use Moderate alcohol use. Caffeine use Coffee, Tea. No drug use Tobacco use Former smoker.  Family History (Sylvia Ledford, RN; 03/11/2016 7:35 AM) Breast Cancer Family Members In General. Cancer Family Members In General. Cerebrovascular Accident Family Members In General. Hypertension Father. Melanoma Family Members In General. Prostate Cancer Father. Seizure disorder Family Members In General.  Pregnancy / Birth History (Sylvia Ledford, RN; 03/11/2016 7:35 AM) Age at menarche 10  years. Age of menopause 51-55 Contraceptive History Intrauterine device, Oral contraceptives. Gravida 3 Length (months) of breastfeeding 3-6 Maternal age 21-25 Para 3  Other Problems (Sylvia Ledford, RN; 03/11/2016 7:35 AM) Back Pain Hypercholesterolemia Lump In Breast     Review of Systems (Sylvia Ledford RN; 03/11/2016 7:35 AM) General Not Present- Appetite Loss, Chills, Fatigue, Fever, Night Sweats, Weight Gain and Weight Loss. Skin Not Present- Change in Wart/Mole, Dryness, Hives, Jaundice, New Lesions, Non-Healing Wounds, Rash and Ulcer. HEENT Present- Seasonal Allergies and Wears glasses/contact lenses.   Not Present- Earache, Hearing Loss, Hoarseness, Nose Bleed, Oral Ulcers, Ringing in the Ears, Sinus Pain, Sore Throat, Visual Disturbances and Yellow Eyes. Respiratory Not Present- Bloody sputum, Chronic Cough, Difficulty Breathing, Snoring and Wheezing. Breast Present- Breast Mass and Breast Pain. Not Present- Nipple Discharge and Skin Changes. Cardiovascular Present- Rapid Heart Rate. Not Present- Chest Pain, Difficulty Breathing Lying Down, Leg Cramps, Palpitations, Shortness of Breath and Swelling of Extremities. Gastrointestinal Not Present- Abdominal Pain, Bloating, Bloody Stool, Change in Bowel Habits, Chronic diarrhea, Constipation, Difficulty Swallowing, Excessive gas, Gets full quickly at meals, Hemorrhoids, Indigestion, Nausea, Rectal Pain and Vomiting. Female Genitourinary Not Present- Frequency, Nocturia, Painful Urination, Pelvic Pain and Urgency. Musculoskeletal Present- Back Pain. Not Present- Joint Pain, Joint Stiffness, Muscle Pain, Muscle Weakness and Swelling of Extremities. Neurological Not Present- Decreased Memory, Fainting, Headaches, Numbness, Seizures, Tingling, Tremor, Trouble walking and Weakness. Psychiatric Not Present- Anxiety, Bipolar, Change in Sleep Pattern, Depression, Fearful and Frequent crying. Endocrine Not Present- Cold Intolerance,  Excessive Hunger, Hair Changes, Heat Intolerance, Hot flashes and New Diabetes. Hematology Not Present- Blood Thinners, Easy Bruising, Excessive bleeding, Gland problems, HIV and Persistent Infections.   Physical Exam (Avis Tirone A. Cay Kath MD; 03/11/2016 2:32 PM)  General Mental Status-Alert. General Appearance-Consistent with stated age. Hydration-Well hydrated. Voice-Normal.  Head and Neck Head-normocephalic, atraumatic with no lesions or palpable masses. Trachea-midline. Thyroid Gland Characteristics - normal size and consistency.  Eye Eyeball - Bilateral-Extraocular movements intact. Sclera/Conjunctiva - Bilateral-No scleral icterus.  Chest and Lung Exam Chest and lung exam reveals -quiet, even and easy respiratory effort with no use of accessory muscles and on auscultation, normal breath sounds, no adventitious sounds and normal vocal resonance. Inspection Chest Wall - Normal. Back - normal.  Breast Breast - Left-Symmetric, Non Tender, No Biopsy scars, no Dimpling, No Inflammation, No Lumpectomy scars, No Mastectomy scars, No Peau d' Orange. Breast - Right-Symmetric, Non Tender, No Biopsy scars, no Dimpling, No Inflammation, No Lumpectomy scars, No Mastectomy scars, No Peau d' Orange. Breast Lump-No Palpable Breast Mass.  Cardiovascular Cardiovascular examination reveals -normal heart sounds, regular rate and rhythm with no murmurs and normal pedal pulses bilaterally.  Abdomen Inspection Inspection of the abdomen reveals - No Hernias. Skin - Scar - no surgical scars. Palpation/Percussion Palpation and Percussion of the abdomen reveal - Soft, Non Tender, No Rebound tenderness, No Rigidity (guarding) and No hepatosplenomegaly. Auscultation Auscultation of the abdomen reveals - Bowel sounds normal.  Neurologic Neurologic evaluation reveals -alert and oriented x 3 with no impairment of recent or remote memory. Mental  Status-Normal.  Musculoskeletal Normal Exam - Left-Upper Extremity Strength Normal and Lower Extremity Strength Normal. Normal Exam - Right-Upper Extremity Strength Normal and Lower Extremity Strength Normal.  Lymphatic Head & Neck  General Head & Neck Lymphatics: Bilateral - Description - Normal. Axillary  General Axillary Region: Bilateral - Description - Normal. Tenderness - Non Tender. Femoral & Inguinal  Generalized Femoral & Inguinal Lymphatics: Bilateral - Description - Normal. Tenderness - Non Tender.    Assessment & Plan (Issac Moure A. Khalon Cansler MD; 03/11/2016 2:33 PM)  BREAST CANCER, RIGHT (C50.911) Impression: discussed breast conservation versus mastectomy with reconstruction. Risks, benefits and alternatives to surgery discussed. She would like to proceed with right breas seed localized partial mastectomy with right axillary sentinel lymph node mapping. Risk of lumpectomy include bleeding, infection, seroma, more surgery, use of seed/wire, wound care, cosmetic deformity and the need for other treatments, death , blood clots, death. Pt agrees to proceed. Risk of sentinel lymph node mapping include bleeding, infection,   lymphedema, shoulder pain. stiffness, dye allergy. cosmetic deformity , blood clots, death, need for more surgery. Pt agres to proceed.  Current Plans You are being scheduled for surgery- Our schedulers will call you.  You should hear from our office's scheduling department within 5 working days about the location, date, and time of surgery. We try to make accommodations for patient's preferences in scheduling surgery, but sometimes the OR schedule or the surgeon's schedule prevents us from making those accommodations.  If you have not heard from our office ([phone]) in 5 working days, call the office and ask for your surgeon's nurse.  If you have other questions about your diagnosis, plan, or surgery, call the office and ask for your surgeon's  nurse.  Pt Education - [facility] Breast Cancer Information Given - [facility] "Breast Journey" Package Pt Education - Pamphlet Given - Breast Biopsy: discussed with patient and provided information. We discussed the staging and pathophysiology of breast cancer. We discussed all of the different options for treatment for breast cancer including surgery, chemotherapy, radiation therapy, Herceptin, and antiestrogen therapy. We discussed a sentinel lymph node biopsy as she does not appear to having lymph node involvement right now. We discussed the performance of that with injection of radioactive tracer and blue dye. We discussed that she would have an incision underneath her axillary hairline. We discussed that there is a bout a 10-20% chance of having a positive node with a sentinel lymph node biopsy and we will await the permanent pathology to make any other first further decisions in terms of her treatment. One of these options might be to return to the operating room to perform an axillary lymph node dissection. We discussed about a 1-2% risk lifetime of chronic shoulder pain as well as lymphedema associated with a sentinel lymph node biopsy. We discussed the options for treatment of the breast cancer which included lumpectomy versus a mastectomy. We discussed the performance of the lumpectomy with a wire placement. We discussed a 10-20% chance of a positive margin requiring reexcision in the operating room. We also discussed that she may need radiation therapy or antiestrogen therapy or both if she undergoes lumpectomy. We discussed the mastectomy and the postoperative care for that as well. We discussed that there is no difference in her survival whether she undergoes lumpectomy with radiation therapy or antiestrogen therapy versus a mastectomy. There is a slight difference in the local recurrence rate being 3-5% with lumpectomy and about 1% with a mastectomy. We discussed the risks of operation including  bleeding, infection, possible reoperation. She understands her further therapy will be based on what her stages at the time of her operation.  Pt Education - [facility] breast cancer surgery: discussed with patient and provided information. Pt Education - [facility] Breast Biopsy HCI: discussed with patient and provided information. 

## 2016-04-02 NOTE — Interval H&P Note (Signed)
History and Physical Interval Note:  04/02/2016 10:23 AM  Teresa Mitchell  has presented today for surgery, with the diagnosis of RIGHT BREAST CANCER  The various methods of treatment have been discussed with the patient and family. After consideration of risks, benefits and other options for treatment, the patient has consented to  Procedure(s): RADIOACTIVE SEED GUIDED PARTIAL MASTECTOMY WITH AXILLARY SENTINEL LYMPH NODE BIOPSY (Right) as a surgical intervention .  The patient's history has been reviewed, patient examined, no change in status, stable for surgery.  I have reviewed the patient's chart and labs.  Questions were answered to the patient's satisfaction.     Augustine Leverette A.

## 2016-04-02 NOTE — Discharge Instructions (Signed)
[facility] °Office Phone Number [phone] ° °BREAST BIOPSY/ PARTIAL MASTECTOMY: POST OP INSTRUCTIONS ° °Always review your discharge instruction sheet given to you by the facility where your surgery was performed. ° °IF YOU HAVE DISABILITY OR FAMILY LEAVE FORMS, YOU MUST BRING THEM TO THE OFFICE FOR PROCESSING.  DO NOT GIVE THEM TO YOUR DOCTOR. ° °1. A prescription for pain medication may be given to you upon discharge.  Take your pain medication as prescribed, if needed.  If narcotic pain medicine is not needed, then you may take acetaminophen (Tylenol) or ibuprofen (Advil) as needed. °2. Take your usually prescribed medications unless otherwise directed °3. If you need a refill on your pain medication, please contact your pharmacy.  They will contact our office to request authorization.  Prescriptions will not be filled after 5pm or on week-ends. °4. You should eat very light the first 24 hours after surgery, such as soup, crackers, pudding, etc.  Resume your normal diet the day after surgery. °5. Most patients will experience some swelling and bruising in the breast.  Ice packs and a good support bra will help.  Swelling and bruising can take several days to resolve.  °6. It is common to experience some constipation if taking pain medication after surgery.  Increasing fluid intake and taking a stool softener will usually help or prevent this problem from occurring.  A mild laxative (Milk of Magnesia or Miralax) should be taken according to package directions if there are no bowel movements after 48 hours. °7. Unless discharge instructions indicate otherwise, you may remove your bandages 24-48 hours after surgery, and you may shower at that time.  You may have steri-strips (small skin tapes) in place directly over the incision.  These strips should be left on the skin for 7-10 days.  If your surgeon used skin glue on the incision, you may shower in 24 hours.  The glue will flake off over the  next 2-3 weeks.  Any sutures or staples will be removed at the office during your follow-up visit. °8. ACTIVITIES:  You may resume regular daily activities (gradually increasing) beginning the next day.  Wearing a good support bra or sports bra minimizes pain and swelling.  You may have sexual intercourse when it is comfortable. °a. You may drive when you no longer are taking prescription pain medication, you can comfortably wear a seatbelt, and you can safely maneuver your car and apply brakes. °b. RETURN TO WORK:  ______________________________________________________________________________________ °9. You should see your doctor in the office for a follow-up appointment approximately two weeks after your surgery.  Your doctor’s nurse will typically make your follow-up appointment when she calls you with your pathology report.  Expect your pathology report 2-3 business days after your surgery.  You may call to check if you do not hear from us after three days. °10. OTHER INSTRUCTIONS: _______________________________________________________________________________________________ _____________________________________________________________________________________________________________________________________ °_____________________________________________________________________________________________________________________________________ °_____________________________________________________________________________________________________________________________________ ° °WHEN TO CALL YOUR DOCTOR: °1. Fever over 101.0 °2. Nausea and/or vomiting. °3. Extreme swelling or bruising. °4. Continued bleeding from incision. °5. Increased pain, redness, or drainage from the incision. ° °The clinic staff is available to answer your questions during regular business hours.  Please don’t hesitate to call and ask to speak to one of the nurses for clinical concerns.  If you have a medical emergency, go to the nearest  emergency room or call 911.  A surgeon from [facility] is always on call at the hospital. ° °For further questions, please visit [email or web address]  ° ° ° °  Post Anesthesia Home Care Instructions ° °Activity: °Get plenty of rest for the remainder of the day. A responsible adult should stay with you for 24 hours following the procedure.  °For the next 24 hours, DO NOT: °-Drive a car °-Operate machinery °-Drink alcoholic beverages °-Take any medication unless instructed by your physician °-Make any legal decisions or sign important papers. ° °Meals: °Start with liquid foods such as gelatin or soup. Progress to regular foods as tolerated. Avoid greasy, spicy, heavy foods. If nausea and/or vomiting occur, drink only clear liquids until the nausea and/or vomiting subsides. Call your physician if vomiting continues. ° °Special Instructions/Symptoms: °Your throat may feel dry or sore from the anesthesia or the breathing tube placed in your throat during surgery. If this causes discomfort, gargle with warm salt water. The discomfort should disappear within 24 hours. ° °If you had a scopolamine patch placed behind your ear for the management of post- operative nausea and/or vomiting: ° °1. The medication in the patch is effective for 72 hours, after which it should be removed.  Wrap patch in a tissue and discard in the trash. Wash hands thoroughly with soap and water. °2. You may remove the patch earlier than 72 hours if you experience unpleasant side effects which may include dry mouth, dizziness or visual disturbances. °3. Avoid touching the patch. Wash your hands with soap and water after contact with the patch. °  ° °

## 2016-04-03 MED ORDER — BUPIVACAINE-EPINEPHRINE (PF) 0.5% -1:200000 IJ SOLN
INTRAMUSCULAR | Status: DC | PRN
  Administered 2016-04-02: 30 mL via PERINEURAL

## 2016-04-03 NOTE — Anesthesia Procedure Notes (Addendum)
Anesthesia Regional Block: Pectoralis block   Pre-Anesthetic Checklist: ,, timeout performed, Correct Patient, Correct Site, Correct Laterality, Correct Procedure, Correct Position, site marked, Risks and benefits discussed,  Surgical consent,  Pre-op evaluation,  At surgeon's request and post-op pain management  Laterality: Right  Prep: chloraprep       Needles:  Injection technique: Single-shot  Needle Type: Echogenic Stimulator Needle     Needle Length: 9cm  Needle Gauge: 21   Needle insertion depth: 5 cm   Additional Needles:   Procedures: ultrasound guided,,,,,,,,  Narrative:  Start time: 04/02/2016 9:35 AM End time: 04/02/2016 9:40 AM  Performed by: Personally  Anesthesiologist: Josephine Igo  Additional Notes: Timeout performed. Patient sedated. Relevant anatomy ID'd using [place]. Incremental 61ml injection with frequent aspiration. Patient tolerated procedure well.

## 2016-04-03 NOTE — Addendum Note (Signed)
Addendum  created 04/03/16 1823 by Josephine Igo, MD   Anesthesia Intra Blocks edited, Anesthesia Intra Meds edited, Child order released for a procedure order, Sign clinical note

## 2016-04-05 ENCOUNTER — Encounter (HOSPITAL_BASED_OUTPATIENT_CLINIC_OR_DEPARTMENT_OTHER): Payer: Self-pay | Admitting: Surgery

## 2016-04-09 ENCOUNTER — Ambulatory Visit (HOSPITAL_BASED_OUTPATIENT_CLINIC_OR_DEPARTMENT_OTHER): Payer: BLUE CROSS/BLUE SHIELD | Admitting: Hematology and Oncology

## 2016-04-09 ENCOUNTER — Encounter: Payer: Self-pay | Admitting: Hematology and Oncology

## 2016-04-09 ENCOUNTER — Telehealth: Payer: Self-pay | Admitting: *Deleted

## 2016-04-09 DIAGNOSIS — C50511 Malignant neoplasm of lower-outer quadrant of right female breast: Secondary | ICD-10-CM

## 2016-04-09 DIAGNOSIS — Z170 Estrogen receptor positive status [ER+]: Secondary | ICD-10-CM | POA: Diagnosis not present

## 2016-04-09 NOTE — Progress Notes (Signed)
Patient Care Team: Kelton Pillar, MD as PCP - General (Family Medicine) Paula Compton, MD as Consulting Physician (Obstetrics and Gynecology) Erroll Luna, MD as Consulting Physician (General Surgery) Nicholas Lose, MD as Consulting Physician (Hematology and Oncology) Kyung Rudd, MD as Consulting Physician (Radiation Oncology)  DIAGNOSIS:  Encounter Diagnosis  Name Primary?  . Malignant neoplasm of lower-outer quadrant of right breast of female, estrogen receptor positive ([place])     SUMMARY OF ONCOLOGIC HISTORY:   Malignant neoplasm of lower-outer quadrant of right breast of female, estrogen receptor positive ([place])   03/02/2016 Initial Diagnosis    Right breast biopsy 8:00: Invasive ductal carcinoma grade 1-2, ER 90%, PR 20%, Ki-67 12%, HER-2 negative, screening detected right breast mass LOQ posterior depth 8:00 position 5 mm, axilla negative, T1 1 N0 stage IA clinical stage      04/02/2016 Surgery    Right lumpectomy: IDC grade 2, 0.7 cm, margins negative, one benign lymph node, T1b N0 stage IA, ER 90%, PR 20%, HER-2 negative, Ki-67 12%      CHIEF COMPLIANT: Follow-up of recurrent right lumpectomy  INTERVAL HISTORY: Teresa Mitchell is a 61 year old with above-mentioned history of right breast cancer underwent lumpectomy and is here today to discuss the pathology report. She has recovered very well from the recent surgery. She continues to have discomfort underneath the arm as well as occasional shooting pains in the breast. Overall she has not been requiring any narcotic pain medications.  REVIEW OF SYSTEMS:   Constitutional: Denies fevers, chills or abnormal weight loss Eyes: Denies blurriness of vision Ears, nose, mouth, throat, and face: Denies mucositis or sore throat Respiratory: Denies cough, dyspnea or wheezes Cardiovascular: Denies palpitation, chest discomfort Gastrointestinal:  Denies nausea, heartburn or change in bowel habits Skin: Denies abnormal skin  rashes Lymphatics: Denies new lymphadenopathy or easy bruising Neurological:Denies numbness, tingling or new weaknesses Behavioral/Psych: Mood is stable, no new changes  Extremities: No lower extremity edema Breast: Recent right lumpectomy All other systems were reviewed with the patient and are negative.  I have reviewed the past medical history, past surgical history, social history and family history with the patient and they are unchanged from previous note.  ALLERGIES:  has No Known Allergies.  MEDICATIONS:  Current Outpatient Prescriptions  Medication Sig Dispense Refill  . Biotin 1 MG CAPS Take by mouth.    . Fish Oil OIL by Does not apply route.    . Ibuprofen-Diphenhydramine HCl (ADVIL PM) 200-25 MG CAPS Take 1 tablet by mouth.    Marland Kitchen MAGNESIUM ASPARTATE PO Take 200 mg by mouth 2 (two) times daily.     Marland Kitchen oxyCODONE-acetaminophen (ROXICET) 5-325 MG tablet Take 1-2 tablets by mouth every 4 (four) hours as needed. 15 tablet 0  . pravastatin (PRAVACHOL) 20 MG tablet Take 20 mg by mouth daily.    . Probiotic Product (PROBIOTIC-10 PO) Take 1 tablet by mouth.    . vitamin B-12 (CYANOCOBALAMIN) 100 MCG tablet Take 3,000 mcg by mouth daily.      No current facility-administered medications for this visit.     PHYSICAL EXAMINATION: ECOG PERFORMANCE STATUS: 1 - Symptomatic but completely ambulatory  Vitals:   04/09/16 0924  BP: 108/69  Pulse: 73  Resp: 18  Temp: 97.7 F (36.5 C)   Filed Weights   04/09/16 0924  Weight: 140 lb 3 oz (63.6 kg)    GENERAL:alert, no distress and comfortable SKIN: skin color, texture, turgor are normal, no rashes or significant lesions EYES: normal, Conjunctiva are  pink and non-injected, sclera clear OROPHARYNX:no exudate, no erythema and lips, buccal mucosa, and tongue normal  NECK: supple, thyroid normal size, non-tender, without nodularity LYMPH:  no palpable lymphadenopathy in the cervical, axillary or inguinal LUNGS: clear to auscultation  and percussion with normal breathing effort HEART: regular rate & rhythm and no murmurs and no lower extremity edema ABDOMEN:abdomen soft, non-tender and normal bowel sounds MUSCULOSKELETAL:no cyanosis of digits and no clubbing  NEURO: alert & oriented x 3 with fluent speech, no focal motor/sensory deficits EXTREMITIES: No lower extremity edema  LABORATORY DATA:  I have reviewed the data as listed   Chemistry      Component Value Date/Time   NA 141 03/11/2016 1249   K 3.9 03/11/2016 1249   CO2 26 03/11/2016 1249   BUN 9.1 03/11/2016 1249   CREATININE 0.7 03/11/2016 1249      Component Value Date/Time   CALCIUM 9.9 03/11/2016 1249   ALKPHOS 60 03/11/2016 1249   AST 21 03/11/2016 1249   ALT 25 03/11/2016 1249   BILITOT 0.95 03/11/2016 1249       Lab Results  Component Value Date   WBC 5.0 03/11/2016   HGB 13.9 03/11/2016   HCT 41.8 03/11/2016   MCV 89.8 03/11/2016   PLT 177 03/11/2016   NEUTROABS 2.8 03/11/2016    ASSESSMENT & PLAN:  Malignant neoplasm of lower-outer quadrant of right breast of female, estrogen receptor positive ([place]) Right lumpectomy 04/02/2016: IDC grade 2, 0.7 cm, margins negative, one benign lymph node, T1b N0 stage IA, ER 90%, PR 20%, HER-2 negative, Ki-67 12%  Pathology counseling: I discussed the final pathology report of the patient provided  a copy of this report. I discussed the margins as well as lymph node surgeries. We also discussed the final staging along with previously performed ER/PR and HER-2/neu testing.  Recommendation: 1. Oncotype DX testing to determine if she would benefit from adjuvant chemotherapy 2. followed by adjuvant radiation 3. Followed by adjuvant antiestrogen therapy  Oncotype counseling: I discussed Oncotype DX test. I explained to the patient that this is a 21 gene panel to evaluate patient tumors DNA to calculate recurrence score. This would help determine whether patient has high risk or intermediate risk or low  risk breast cancer. She understands that if her tumor was found to be high risk, she would benefit from systemic chemotherapy. If low risk, no need of chemotherapy. If she was found to be intermediate risk, we would need to evaluate the score as well as other risk factors and determine if an abbreviated chemotherapy may be of benefit.  Return to clinic based upon Oncotype DX result   I spent 25 minutes talking to the patient of which more than half was spent in counseling and coordination of care.  No orders of the defined types were placed in this encounter.  The patient has a good understanding of the overall plan. she agrees with it. she will call with any problems that may develop before the next visit here.   Rulon Eisenmenger, MD 04/09/16

## 2016-04-09 NOTE — Telephone Encounter (Signed)
Ordered oncotype per Dr. Lindi Adie.  Faxed requisition to pathology. Faxed PA to [facility].

## 2016-04-09 NOTE — Assessment & Plan Note (Signed)
Right lumpectomy 04/02/2016: IDC grade 2, 0.7 cm, margins negative, one benign lymph node, T1b N0 stage IA, ER 90%, PR 20%, HER-2 negative, Ki-67 12%  Pathology counseling: I discussed the final pathology report of the patient provided  a copy of this report. I discussed the margins as well as lymph node surgeries. We also discussed the final staging along with previously performed ER/PR and HER-2/neu testing.  Recommendation: 1. Oncotype DX testing to determine if she would benefit from adjuvant chemotherapy 2. followed by adjuvant radiation 3. Followed by adjuvant antiestrogen therapy  Oncotype counseling: I discussed Oncotype DX test. I explained to the patient that this is a 21 gene panel to evaluate patient tumors DNA to calculate recurrence score. This would help determine whether patient has high risk or intermediate risk or low risk breast cancer. She understands that if her tumor was found to be high risk, she would benefit from systemic chemotherapy. If low risk, no need of chemotherapy. If she was found to be intermediate risk, we would need to evaluate the score as well as other risk factors and determine if an abbreviated chemotherapy may be of benefit.  Return to clinic based upon Oncotype DX result

## 2016-04-15 ENCOUNTER — Ambulatory Visit: Payer: BLUE CROSS/BLUE SHIELD

## 2016-04-15 ENCOUNTER — Ambulatory Visit: Payer: BLUE CROSS/BLUE SHIELD | Admitting: Radiation Oncology

## 2016-04-16 DIAGNOSIS — C50511 Malignant neoplasm of lower-outer quadrant of right female breast: Secondary | ICD-10-CM | POA: Diagnosis not present

## 2016-04-20 ENCOUNTER — Telehealth: Payer: Self-pay | Admitting: *Deleted

## 2016-04-20 NOTE — Telephone Encounter (Signed)
Received oncotype score of 15/10%. Physician team notified. Called pt with results and discussed her next step is xrt. Received verbal understanding.

## 2016-04-21 ENCOUNTER — Encounter: Payer: Self-pay | Admitting: Radiation Oncology

## 2016-04-22 ENCOUNTER — Encounter (HOSPITAL_COMMUNITY): Payer: Self-pay

## 2016-05-05 NOTE — Progress Notes (Signed)
Location of Breast Cancer:Right breast lower-outer quadrant   Histology per Pathology Report: 04-02-16 Diagnosis 1. Breast, lumpectomy, Right w/seed INVASIVE DUCTAL CARCINOMA, GRADE 2, SPANNING 0.7 CM ALL MARGINS OF RESECTION ARE NEGATIVE FOR CARCINOMA 2. Breast, excision, Right additional Anterior Margin BENIGN BREAST TISSUE 3. Lymph node, sentinel, biopsy, Right Axillary ONE BENIGN LYMPH NODE (0/1)   Receptor Status: ER(90 %), PR (20%), Her2-neu (neg), Ki-(12%)  Did patient present with symptoms (if so, please note symptoms) or was this found on screening mammography?: screening mammogram that detected a right breast mass  Past/Anticipated interventions by surgeon, if any:04-03-06 Right lumpectomy Dr. Erroll Luna  Past/Anticipated interventions by medical oncology, if CNG:[id number] DX testing,adjuvant radiation therapy, Followed by adjuvant antiestrogen therapy  Lymphedema issues, if any: None  Pain issues, if any: 2/10 Right  Breast having shooting type pain  SAFETY ISSUES:  Prior radiation? No  Pacemaker/ICD? No  Possible current pregnancy? No  Is the patient on methotrexate? No   Menarche age 4, G 3 P3  BC 2 years , IUD 4-5 years  Menopause 4    HRT 7-8 years Current Complaints / other details:  Married, no family history of breast cancer BP 109/65   Pulse 66   Temp 98.1 F (36.7 C) (Oral)   Resp 16   Ht '5\' 1"'$  (1.549 m)   Wt 135 lb 12.8 oz (61.6 kg)   SpO2 98%   BMI 25.66 kg/m     Georgena Spurling, RN 05/05/2016,12:06 PM

## 2016-05-07 ENCOUNTER — Ambulatory Visit
Admission: RE | Admit: 2016-05-07 | Discharge: 2016-05-07 | Disposition: A | Discharge status: Home / Self Care | Payer: BLUE CROSS/BLUE SHIELD | Source: Ambulatory Visit | Attending: Radiation Oncology | Admitting: Radiation Oncology

## 2016-05-07 ENCOUNTER — Encounter: Payer: Self-pay | Admitting: Radiation Oncology

## 2016-05-07 VITALS — BP 109/65 | HR 66 | Temp 98.1°F | Resp 16 | Ht 61.0 in | Wt 135.8 lb

## 2016-05-07 DIAGNOSIS — Z170 Estrogen receptor positive status [ER+]: Secondary | ICD-10-CM | POA: Insufficient documentation

## 2016-05-07 DIAGNOSIS — Z510 Encounter for antineoplastic radiation therapy: Secondary | ICD-10-CM | POA: Insufficient documentation

## 2016-05-07 DIAGNOSIS — Z8489 Family history of other specified conditions: Secondary | ICD-10-CM | POA: Diagnosis not present

## 2016-05-07 DIAGNOSIS — Z825 Family history of asthma and other chronic lower respiratory diseases: Secondary | ICD-10-CM | POA: Insufficient documentation

## 2016-05-07 DIAGNOSIS — Z9889 Other specified postprocedural states: Secondary | ICD-10-CM | POA: Diagnosis not present

## 2016-05-07 DIAGNOSIS — Z79899 Other long term (current) drug therapy: Secondary | ICD-10-CM | POA: Diagnosis not present

## 2016-05-07 DIAGNOSIS — Z9011 Acquired absence of right breast and nipple: Secondary | ICD-10-CM | POA: Insufficient documentation

## 2016-05-07 DIAGNOSIS — C50511 Malignant neoplasm of lower-outer quadrant of right female breast: Secondary | ICD-10-CM

## 2016-05-07 DIAGNOSIS — C50512 Malignant neoplasm of lower-outer quadrant of left female breast: Principal | ICD-10-CM

## 2016-05-07 HISTORY — DX: Malignant neoplasm of lower-outer quadrant of right female breast: C50.511

## 2016-05-08 ENCOUNTER — Encounter: Payer: Self-pay | Admitting: Radiation Oncology

## 2016-05-08 NOTE — Progress Notes (Signed)
Radiation Oncology         [phone] ________________________________  Name: Teresa Mitchell MRN: [id number]  Date: 05/07/2016  DOB: July 31, 1955  YI:[id number] Theda Sers, MD  Nicholas Lose, MD     REFERRING PHYSICIAN: Nicholas Lose, MD   DIAGNOSIS: The encounter diagnosis was Malignant neoplasm of lower-outer quadrant of right female breast, unspecified estrogen receptor status ([place]). Clinical stage IA (T1aN0) ER/PR positive, grade 1-2 invasive ductal carcinoma of the right breast.  HISTORY OF PRESENT ILLNESS: Teresa Mitchell is a 61 y.o. female originally seen at the multidisciplinary breast clinic, with her daughter, for invasive ductal carcinoma of the right breast. She had a screening mammogram in January 2018 showing a possible right breast mass. The patient was then recalled for a diagnostic right breast mammogram and [place] on 02/28/16. Mammogram showed a 0.5 cm mass in the posterior out right breast. US showed a 0.5 x 0.4 x 0.3 cm irregular hypoechoic mass at the 8:00 position 5 cm from the nipple felt to represent the mammographic finding. US of the right axilla was negative. The patient had a biopsy of the right breast on 03/02/16 revealing grade 1-2 invasive ductal carcinoma and adenosis with calcifications (ER 90% +, PR 20% +, HER2 -, Ki67 12%).   She has since undergone right partial mastectomy/lumpectomy with sentinel node evaluation on 04/02/16. Final pathology revealed a 7 mm grade 2 invasive ductal carcinoma with all margins negative, and additional right anterior margin was benign. Her sentinel node was negative for disease. Her oncotype score was 15, and felt to be very low risk and she will not receive any chemotherapy. She comes today to discuss starting radiotherapy.  PREVIOUS RADIATION THERAPY: No   PAST MEDICAL HISTORY:  Past Medical History:  Diagnosis Date  . Breast cancer of lower-outer quadrant of right female breast ([place])   . Plantar fasciitis        PAST  SURGICAL HISTORY: Past Surgical History:  Procedure Laterality Date  . CESAREAN SECTION    . FEMUR FRACTURE SURGERY Left   . RADIOACTIVE SEED GUIDED MASTECTOMY WITH AXILLARY SENTINEL LYMPH NODE BIOPSY Right 04/02/2016   Procedure: RADIOACTIVE SEED GUIDED PARTIAL MASTECTOMY WITH AXILLARY SENTINEL LYMPH NODE BIOPSY;  Surgeon: Erroll Luna, MD;  Location: [facility];  Service: General;  Laterality: Right;  . TONSILLECTOMY       FAMILY HISTORY:  Family History  Problem Relation Age of Onset  . Asthma Maternal Grandfather   . Migraines Maternal Grandmother   . Allergic rhinitis Neg Hx   . Angioedema Neg Hx   . Atopy Neg Hx   . Eczema Neg Hx   . Immunodeficiency Neg Hx   . Urticaria Neg Hx      SOCIAL HISTORY:  reports that she has been smoking E-cigarettes.  She has never used smokeless tobacco. She reports that she drinks about 4.2 oz of alcohol per week . She reports that she does not use drugs. The patient has three children and is Married. She lives in [place]. 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 Her adult daughter Hildred Alamin works at [employer].   ALLERGIES: Patient has no known allergies.   MEDICATIONS:  Current Outpatient Prescriptions  Medication Sig Dispense Refill  . Biotin 1 MG CAPS Take by mouth.    . diphenhydramine-acetaminophen (TYLENOL PM) 25-500 MG TABS tablet Take 1 tablet by mouth at bedtime as needed.    . Fish Oil OIL by Does not apply route.    Marland Kitchen  MAGNESIUM ASPARTATE PO Take 200 mg by mouth 2 (two) times daily.     . pravastatin (PRAVACHOL) 20 MG tablet Take 20 mg by mouth daily.    . Probiotic Product (PROBIOTIC-10 PO) Take 1 tablet by mouth.    . vitamin B-12 (CYANOCOBALAMIN) 100 MCG tablet Take 3,000 mcg by mouth daily.     Marland Kitchen oxyCODONE-acetaminophen (ROXICET) 5-325 MG tablet Take 1-2 tablets by mouth every 4 (four) hours as needed. (Patient not taking: Reported on 05/07/2016) 15 tablet 0    No current facility-administered medications for this encounter.      REVIEW OF SYSTEMS: On review of systems, the patient reports that she is doing well overall since her surgery. She denies any significant concerns with her healing process. She notes tenderness when sleeping on her right side and is trying to sleep on her left side now. She denies any chest pain, shortness of breath, cough, fevers, chills, night sweats, unintended weight changes. She denies any bowel or bladder disturbances, and denies abdominal pain, nausea or vomiting. She denies any new musculoskeletal or joint aches or pains. A complete review of systems is obtained and is otherwise negative.     PHYSICAL EXAM:  Wt Readings from Last 3 Encounters:  05/07/16 135 lb 12.8 oz (61.6 kg)  04/09/16 140 lb 3 oz (63.6 kg)  04/02/16 134 lb (60.8 kg)   Temp Readings from Last 3 Encounters:  05/07/16 98.1 F (36.7 C) (Oral)  04/09/16 97.7 F (36.5 C) (Oral)  04/02/16 97.4 F (36.3 C) (Oral)   BP Readings from Last 3 Encounters:  05/07/16 109/65  04/09/16 108/69  04/02/16 114/70   Pulse Readings from Last 3 Encounters:  05/07/16 66  04/09/16 73  04/02/16 90   In general this is a well appearing caucasian female in no acute distress. She's alert and oriented x4 and appropriate throughout the examination. Cardiopulmonary assessment is negative for acute distress and she exhibits normal effort. The right breast is healing well along the lumpectomy site and axilla is intact. No separation, cellulitic change, or bleeding is noted. No edema of the RUE or chest wall is noted.   ECOG = 0  0 - Asymptomatic (Fully active, able to carry on all predisease activities without restriction)  1 - Symptomatic but completely ambulatory (Restricted in physically strenuous activity but ambulatory and able to carry out work of a light or sedentary nature. For example, light housework, office work)  2 - Symptomatic, <50% in bed during  the day (Ambulatory and capable of all self care but unable to carry out any work activities. Up and about more than 50% of waking hours)  3 - Symptomatic, >50% in bed, but not bedbound (Capable of only limited self-care, confined to bed or chair 50% or more of waking hours)  4 - Bedbound (Completely disabled. Cannot carry on any self-care. Totally confined to bed or chair)  5 - Death   Eustace Pen MM, Creech RH, Tormey DC, et al. [phone]). "Toxicity and response criteria of the [facility]". [place] Oncol. 5 (6): 649-55    LABORATORY DATA:  Lab Results  Component Value Date   WBC 5.0 03/11/2016   HGB 13.9 03/11/2016   HCT 41.8 03/11/2016   MCV 89.8 03/11/2016   PLT 177 03/11/2016   Lab Results  Component Value Date   NA 141 03/11/2016   K 3.9 03/11/2016   CO2 26 03/11/2016   Lab Results  Component Value Date   ALT  25 03/11/2016   AST 21 03/11/2016   ALKPHOS 60 03/11/2016   BILITOT 0.95 03/11/2016      RADIOGRAPHY: No results found.     IMPRESSION/PLAN: 1. Stage IA cT1aN0, ER/PR positive, grade 1-2 invasive ductal carcinoma of the right breast. We reviewed the patient's pathology and low oncotype score. As she does not have  any need of chemotherapy, we can move forward with radiotherapy. She understands that there are still recommendations for  antiestrogen therapy which she will discuss with Dr. Lindi Adie. We discussed the risks, benefits, short, and long term effects of radiotherapy, and the patient is interested in proceeding. Dr. Lisbeth Renshaw discussed the delivery and logistics of radiotherapy, and would recommend a course of 4 weeks of treatment. We will plan to simulate early next week. Written consent is obtained, placed in the chart, and a copy provided to the patient.   In a visit lasting 30 minutes, greater than 50% of the time was spent face to face discussing her pathology, and coordinating the patient's care.   The above documentation reflects my  direct findings during this shared patient visit. Please see the separate note by Dr. Lisbeth Renshaw on this date for the remainder of the patient's plan of care.    Carola Rhine, PAC

## 2016-05-13 ENCOUNTER — Ambulatory Visit
Admission: RE | Admit: 2016-05-13 | Discharge: 2016-05-13 | Disposition: A | Discharge status: Home / Self Care | Payer: BLUE CROSS/BLUE SHIELD | Source: Ambulatory Visit | Attending: Radiation Oncology | Admitting: Radiation Oncology

## 2016-05-13 DIAGNOSIS — Z170 Estrogen receptor positive status [ER+]: Secondary | ICD-10-CM | POA: Diagnosis not present

## 2016-05-13 DIAGNOSIS — Z825 Family history of asthma and other chronic lower respiratory diseases: Secondary | ICD-10-CM | POA: Diagnosis not present

## 2016-05-13 DIAGNOSIS — Z79899 Other long term (current) drug therapy: Secondary | ICD-10-CM | POA: Diagnosis not present

## 2016-05-13 DIAGNOSIS — C50511 Malignant neoplasm of lower-outer quadrant of right female breast: Secondary | ICD-10-CM | POA: Diagnosis not present

## 2016-05-13 DIAGNOSIS — Z9889 Other specified postprocedural states: Secondary | ICD-10-CM | POA: Diagnosis not present

## 2016-05-13 DIAGNOSIS — Z510 Encounter for antineoplastic radiation therapy: Secondary | ICD-10-CM | POA: Diagnosis not present

## 2016-05-13 DIAGNOSIS — Z8489 Family history of other specified conditions: Secondary | ICD-10-CM | POA: Diagnosis not present

## 2016-05-13 DIAGNOSIS — Z9011 Acquired absence of right breast and nipple: Secondary | ICD-10-CM | POA: Diagnosis not present

## 2016-05-14 NOTE — Progress Notes (Signed)
  Radiation Oncology         (336) [phone] ________________________________  Name: Teresa Mitchell MRN: [id number]  Date: 05/13/2016  DOB: 10/10/1955   DIAGNOSIS:     ICD-9-CM ICD-10-CM   1. Malignant neoplasm of lower-outer quadrant of right breast of female, estrogen receptor positive ([place]) 174.5 C50.511    V86.0 Z17.0     SIMULATION AND TREATMENT PLANNING NOTE  The patient presented for simulation prior to beginning her course of radiation treatment for her diagnosis of right-sided breast cancer. The patient was placed in a supine position on a breast board. A customized vac-lock bag was constructed and this complex treatment device will be used on a daily basis during her treatment. In this fashion, a CT scan was obtained through the chest area and an isocenter was placed near the chest wall within the breast.  The patient will be planned to receive a course of radiation initially to a dose of 42.5 Gy. This will consist of a whole breast radiotherapy technique. To accomplish this, 2 customized blocks have been designed which will correspond to medial and lateral whole breast tangent fields. This treatment will be accomplished at 2.5 Gy per fraction. A forward planning technique will also be evaluated to determine if this approach improves the plan. It is anticipated that the patient will then receive a 7.5 Gy boost to the seroma cavity which has been contoured. This will be accomplished at 2.5 Gy per fraction.   This initial treatment will consist of a 3-D conformal technique. The seroma has been contoured as the primary target structure. Additionally, dose volume histograms of both this target as well as the lungs and heart will also be evaluated. Such an approach is necessary to ensure that the target area is adequately covered while the nearby critical  normal structures are adequately spared.  Plan:  The final anticipated total dose therefore will correspond to 50  Gy.    _______________________________   Jodelle Gross, MD, PhD

## 2016-05-14 NOTE — Progress Notes (Signed)
  Radiation Oncology         (336) [phone] ________________________________  Name: Teresa Mitchell MRN: [id number]  Date: 05/13/2016  DOB: [birth year]  Optical Surface Tracking Plan:  Since intensity modulated radiotherapy (IMRT) and 3D conformal radiation treatment methods are predicated on accurate and precise positioning for treatment, intrafraction motion monitoring is medically necessary to ensure accurate and safe treatment delivery.  The ability to quantify intrafraction motion without excessive ionizing radiation dose can only be performed with optical surface tracking. Accordingly, surface imaging offers the opportunity to obtain 3D measurements of patient position throughout IMRT and 3D treatments without excessive radiation exposure.  I am ordering optical surface tracking for this patient's upcoming course of radiotherapy. ________________________________  Kyung Rudd, MD 05/14/2016 2:30 PM    Reference:   Ursula Alert, J, et al. Surface imaging-based analysis of intrafraction motion for breast radiotherapy patients.Journal of [place], n. 6, nov. 2014. ISSN [id number].   Available at: <http://www.jacmp.org/index.php/jacmp/article/view/4957>.

## 2016-05-15 DIAGNOSIS — Z9889 Other specified postprocedural states: Secondary | ICD-10-CM | POA: Diagnosis not present

## 2016-05-15 DIAGNOSIS — Z170 Estrogen receptor positive status [ER+]: Secondary | ICD-10-CM | POA: Diagnosis not present

## 2016-05-15 DIAGNOSIS — Z510 Encounter for antineoplastic radiation therapy: Secondary | ICD-10-CM | POA: Diagnosis not present

## 2016-05-15 DIAGNOSIS — Z8489 Family history of other specified conditions: Secondary | ICD-10-CM | POA: Diagnosis not present

## 2016-05-15 DIAGNOSIS — Z79899 Other long term (current) drug therapy: Secondary | ICD-10-CM | POA: Diagnosis not present

## 2016-05-15 DIAGNOSIS — Z825 Family history of asthma and other chronic lower respiratory diseases: Secondary | ICD-10-CM | POA: Diagnosis not present

## 2016-05-15 DIAGNOSIS — Z9011 Acquired absence of right breast and nipple: Secondary | ICD-10-CM | POA: Diagnosis not present

## 2016-05-15 DIAGNOSIS — C50511 Malignant neoplasm of lower-outer quadrant of right female breast: Secondary | ICD-10-CM | POA: Diagnosis not present

## 2016-05-20 ENCOUNTER — Ambulatory Visit
Admission: RE | Admit: 2016-05-20 | Discharge: 2016-05-20 | Disposition: A | Discharge status: Home / Self Care | Payer: BLUE CROSS/BLUE SHIELD | Source: Ambulatory Visit | Attending: Radiation Oncology | Admitting: Radiation Oncology

## 2016-05-20 DIAGNOSIS — Z9011 Acquired absence of right breast and nipple: Secondary | ICD-10-CM | POA: Diagnosis not present

## 2016-05-20 DIAGNOSIS — C50511 Malignant neoplasm of lower-outer quadrant of right female breast: Secondary | ICD-10-CM | POA: Diagnosis not present

## 2016-05-20 DIAGNOSIS — Z8489 Family history of other specified conditions: Secondary | ICD-10-CM | POA: Diagnosis not present

## 2016-05-20 DIAGNOSIS — Z9889 Other specified postprocedural states: Secondary | ICD-10-CM | POA: Diagnosis not present

## 2016-05-20 DIAGNOSIS — Z825 Family history of asthma and other chronic lower respiratory diseases: Secondary | ICD-10-CM | POA: Diagnosis not present

## 2016-05-20 DIAGNOSIS — Z170 Estrogen receptor positive status [ER+]: Secondary | ICD-10-CM | POA: Diagnosis not present

## 2016-05-20 DIAGNOSIS — Z79899 Other long term (current) drug therapy: Secondary | ICD-10-CM | POA: Diagnosis not present

## 2016-05-20 DIAGNOSIS — Z510 Encounter for antineoplastic radiation therapy: Secondary | ICD-10-CM | POA: Diagnosis not present

## 2016-05-21 ENCOUNTER — Ambulatory Visit
Admission: RE | Admit: 2016-05-21 | Discharge: 2016-05-21 | Disposition: A | Discharge status: Home / Self Care | Payer: BLUE CROSS/BLUE SHIELD | Source: Ambulatory Visit | Attending: Radiation Oncology | Admitting: Radiation Oncology

## 2016-05-21 DIAGNOSIS — Z79899 Other long term (current) drug therapy: Secondary | ICD-10-CM | POA: Diagnosis not present

## 2016-05-21 DIAGNOSIS — Z8489 Family history of other specified conditions: Secondary | ICD-10-CM | POA: Diagnosis not present

## 2016-05-21 DIAGNOSIS — Z9889 Other specified postprocedural states: Secondary | ICD-10-CM | POA: Diagnosis not present

## 2016-05-21 DIAGNOSIS — Z825 Family history of asthma and other chronic lower respiratory diseases: Secondary | ICD-10-CM | POA: Diagnosis not present

## 2016-05-21 DIAGNOSIS — C50511 Malignant neoplasm of lower-outer quadrant of right female breast: Secondary | ICD-10-CM | POA: Diagnosis not present

## 2016-05-21 DIAGNOSIS — Z170 Estrogen receptor positive status [ER+]: Secondary | ICD-10-CM | POA: Diagnosis not present

## 2016-05-21 DIAGNOSIS — Z9011 Acquired absence of right breast and nipple: Secondary | ICD-10-CM | POA: Diagnosis not present

## 2016-05-21 DIAGNOSIS — Z510 Encounter for antineoplastic radiation therapy: Secondary | ICD-10-CM | POA: Diagnosis not present

## 2016-05-22 ENCOUNTER — Ambulatory Visit
Admission: RE | Admit: 2016-05-22 | Discharge: 2016-05-22 | Disposition: A | Discharge status: Home / Self Care | Payer: BLUE CROSS/BLUE SHIELD | Source: Ambulatory Visit | Attending: Radiation Oncology | Admitting: Radiation Oncology

## 2016-05-22 ENCOUNTER — Other Ambulatory Visit: Payer: Self-pay | Admitting: Radiation Oncology

## 2016-05-22 DIAGNOSIS — Z9011 Acquired absence of right breast and nipple: Secondary | ICD-10-CM | POA: Diagnosis not present

## 2016-05-22 DIAGNOSIS — Z825 Family history of asthma and other chronic lower respiratory diseases: Secondary | ICD-10-CM | POA: Diagnosis not present

## 2016-05-22 DIAGNOSIS — Z510 Encounter for antineoplastic radiation therapy: Secondary | ICD-10-CM | POA: Diagnosis not present

## 2016-05-22 DIAGNOSIS — Z9889 Other specified postprocedural states: Secondary | ICD-10-CM | POA: Diagnosis not present

## 2016-05-22 DIAGNOSIS — Z79899 Other long term (current) drug therapy: Secondary | ICD-10-CM | POA: Diagnosis not present

## 2016-05-22 DIAGNOSIS — C50511 Malignant neoplasm of lower-outer quadrant of right female breast: Secondary | ICD-10-CM

## 2016-05-22 DIAGNOSIS — Z170 Estrogen receptor positive status [ER+]: Secondary | ICD-10-CM | POA: Diagnosis not present

## 2016-05-22 DIAGNOSIS — Z8489 Family history of other specified conditions: Secondary | ICD-10-CM | POA: Diagnosis not present

## 2016-05-25 ENCOUNTER — Ambulatory Visit
Admission: RE | Admit: 2016-05-25 | Discharge: 2016-05-25 | Disposition: A | Discharge status: Home / Self Care | Payer: BLUE CROSS/BLUE SHIELD | Source: Ambulatory Visit | Attending: Radiation Oncology | Admitting: Radiation Oncology

## 2016-05-25 DIAGNOSIS — Z9889 Other specified postprocedural states: Secondary | ICD-10-CM | POA: Diagnosis not present

## 2016-05-25 DIAGNOSIS — Z8489 Family history of other specified conditions: Secondary | ICD-10-CM | POA: Diagnosis not present

## 2016-05-25 DIAGNOSIS — Z9011 Acquired absence of right breast and nipple: Secondary | ICD-10-CM | POA: Diagnosis not present

## 2016-05-25 DIAGNOSIS — Z825 Family history of asthma and other chronic lower respiratory diseases: Secondary | ICD-10-CM | POA: Diagnosis not present

## 2016-05-25 DIAGNOSIS — C50511 Malignant neoplasm of lower-outer quadrant of right female breast: Secondary | ICD-10-CM | POA: Diagnosis not present

## 2016-05-25 DIAGNOSIS — Z510 Encounter for antineoplastic radiation therapy: Secondary | ICD-10-CM | POA: Diagnosis not present

## 2016-05-25 DIAGNOSIS — Z79899 Other long term (current) drug therapy: Secondary | ICD-10-CM | POA: Diagnosis not present

## 2016-05-25 DIAGNOSIS — Z170 Estrogen receptor positive status [ER+]: Secondary | ICD-10-CM | POA: Diagnosis not present

## 2016-05-26 ENCOUNTER — Ambulatory Visit
Admission: RE | Admit: 2016-05-26 | Discharge: 2016-05-26 | Disposition: A | Discharge status: Home / Self Care | Payer: BLUE CROSS/BLUE SHIELD | Source: Ambulatory Visit | Attending: Radiation Oncology | Admitting: Radiation Oncology

## 2016-05-26 DIAGNOSIS — Z79899 Other long term (current) drug therapy: Secondary | ICD-10-CM | POA: Diagnosis not present

## 2016-05-26 DIAGNOSIS — Z9889 Other specified postprocedural states: Secondary | ICD-10-CM | POA: Diagnosis not present

## 2016-05-26 DIAGNOSIS — Z170 Estrogen receptor positive status [ER+]: Secondary | ICD-10-CM | POA: Diagnosis not present

## 2016-05-26 DIAGNOSIS — Z510 Encounter for antineoplastic radiation therapy: Secondary | ICD-10-CM | POA: Diagnosis not present

## 2016-05-26 DIAGNOSIS — C50511 Malignant neoplasm of lower-outer quadrant of right female breast: Secondary | ICD-10-CM | POA: Diagnosis not present

## 2016-05-26 DIAGNOSIS — Z9011 Acquired absence of right breast and nipple: Secondary | ICD-10-CM | POA: Diagnosis not present

## 2016-05-26 DIAGNOSIS — Z8489 Family history of other specified conditions: Secondary | ICD-10-CM | POA: Diagnosis not present

## 2016-05-26 DIAGNOSIS — Z825 Family history of asthma and other chronic lower respiratory diseases: Secondary | ICD-10-CM | POA: Diagnosis not present

## 2016-05-27 ENCOUNTER — Ambulatory Visit: Payer: BLUE CROSS/BLUE SHIELD | Attending: Surgery | Admitting: Physical Therapy

## 2016-05-27 ENCOUNTER — Ambulatory Visit
Admission: RE | Admit: 2016-05-27 | Discharge: 2016-05-27 | Disposition: A | Discharge status: Home / Self Care | Payer: BLUE CROSS/BLUE SHIELD | Source: Ambulatory Visit | Attending: Radiation Oncology | Admitting: Radiation Oncology

## 2016-05-27 DIAGNOSIS — Z483 Aftercare following surgery for neoplasm: Secondary | ICD-10-CM | POA: Diagnosis not present

## 2016-05-27 DIAGNOSIS — Z9889 Other specified postprocedural states: Secondary | ICD-10-CM | POA: Diagnosis not present

## 2016-05-27 DIAGNOSIS — M79601 Pain in right arm: Secondary | ICD-10-CM | POA: Insufficient documentation

## 2016-05-27 DIAGNOSIS — Z825 Family history of asthma and other chronic lower respiratory diseases: Secondary | ICD-10-CM | POA: Diagnosis not present

## 2016-05-27 DIAGNOSIS — C50511 Malignant neoplasm of lower-outer quadrant of right female breast: Secondary | ICD-10-CM | POA: Diagnosis not present

## 2016-05-27 DIAGNOSIS — Z9011 Acquired absence of right breast and nipple: Secondary | ICD-10-CM | POA: Diagnosis not present

## 2016-05-27 DIAGNOSIS — Z170 Estrogen receptor positive status [ER+]: Secondary | ICD-10-CM | POA: Diagnosis not present

## 2016-05-27 DIAGNOSIS — Z79899 Other long term (current) drug therapy: Secondary | ICD-10-CM | POA: Diagnosis not present

## 2016-05-27 DIAGNOSIS — Z510 Encounter for antineoplastic radiation therapy: Secondary | ICD-10-CM | POA: Diagnosis not present

## 2016-05-27 DIAGNOSIS — Z8489 Family history of other specified conditions: Secondary | ICD-10-CM | POA: Diagnosis not present

## 2016-05-27 NOTE — Therapy (Signed)
[place], [place], [address] Phone: [phone]   Fax:  [phone]  Physical Therapy Evaluation  Patient Details  Name: Teresa Mitchell MRN: [id number] Date of Birth: Jun 06, 1955 Referring Provider: Dr. Kyung Rudd  Encounter Date: 05/27/2016      PT End of Session - 05/27/16 1214    Visit Number 1   Number of Visits 1   PT Start Time 1110   PT Stop Time 1205   PT Time Calculation (min) 55 min   Activity Tolerance Patient tolerated treatment well   Behavior During Therapy [facility] for tasks assessed/performed      Past Medical History:  Diagnosis Date  . Breast cancer of lower-outer quadrant of right female breast ([place])   . Plantar fasciitis     Past Surgical History:  Procedure Laterality Date  . CESAREAN SECTION    . FEMUR FRACTURE SURGERY Left   . RADIOACTIVE SEED GUIDED MASTECTOMY WITH AXILLARY SENTINEL LYMPH NODE BIOPSY Right 04/02/2016   Procedure: RADIOACTIVE SEED GUIDED PARTIAL MASTECTOMY WITH AXILLARY SENTINEL LYMPH NODE BIOPSY;  Surgeon: Erroll Luna, MD;  Location: [place];  Service: General;  Laterality: Right;  . TONSILLECTOMY      There were no vitals filed for this visit.       Subjective Assessment - 05/27/16 1112    Subjective Came to [facility] class four weeks ago today.  Has noticed that her arm really hurts with activity and it keeps her up at night.  Little finger is wanting to go numb.   Pertinent History Patient was diagnosed on 02/25/16 with right grade 1-2 invasive ductal carcinoma breast cancer. It is ER/PR positive and HER2 negative with a Ki67 of 12%. It measures 5 mm and is located in the lower outer quadrant.  Lumpectomy 04/02/16 with 1 lymphnode removed (negative) and currently doing XRT (started 05/01/16) and will finish 06/22/16.  No chemo planned, but has had hormone pill recommended--she plans to talk to her doctor about this.   Patient Stated Goals Avoid  lymphedema and have right arm feel more comfortable.   Currently in Pain? Yes   Pain Score 5    Pain Location Arm   Pain Orientation Right;Mid;Lower   Pain Descriptors / Indicators Aching   Pain Radiating Towards and some numbness at fifth digit and ulnar aspect of arm   Aggravating Factors  household chores like vacuuming, maybe sleeping position   Pain Relieving Factors time            [facility] PT Assessment - 05/27/16 0001      Assessment   Medical Diagnosis Right breast cancer   Referring Provider Dr. Kyung Rudd   Onset Date/Surgical Date 04/02/16   Hand Dominance Right   Prior Therapy none     Precautions   Precaution Comments cancer precautions     Restrictions   Weight Bearing Restrictions No     Balance Screen   Has the patient fallen in the past 6 months No   Has the patient had a decrease in activity level because of a fear of falling?  No   Is the patient reluctant to leave their home because of a fear of falling?  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 started radiation     Cognition   Overall Cognitive Status Within Functional Limits for tasks assessed     Observation/Other Assessments   Quick DASH  38.64     Posture/Postural Control   Posture/Postural Control Postural limitations   Postural Limitations Rounded Shoulders;Forward head     AROM   Overall AROM  --  both shoulder WFL in all planes   Cervical Flexion just slightly limited today, with pulling felt posteriorly     Special Tests    Special Tests Cervical  ?positive right UE neural tension stretch   Cervical Tests Spurling's;Dictraction     Spurling's   Findings Negative   Side Right  and left   Comment some tightness right  upper trap     Distraction Test   Findngs Negative           LYMPHEDEMA/ONCOLOGY QUESTIONNAIRE - 05/27/16 1129      Right Upper Extremity Lymphedema   10 cm Proximal to Olecranon Process 30.1 cm   Olecranon Process 24 cm   10 cm Proximal to Ulnar Styloid Process 22.8 cm   Just Proximal to Ulnar Styloid Process 15.1 cm   Across Hand at [employer] 17.9 cm   At [place] of 2nd Digit 5.8 cm   Other weight has been stable     Left Upper Extremity Lymphedema   10 cm Proximal to Olecranon Process 28.8 cm   Olecranon Process 23.9 cm   10 cm Proximal to Ulnar Styloid Process 22.3 cm   Just Proximal to Ulnar Styloid Process 15.2 cm   Across Hand at [employer] 17.2 cm   At [place] of 2nd Digit 5.5 cm           Quick Dash - 05/27/16 0001    Open a tight or new jar Moderate difficulty   Do heavy household chores (wash walls, wash floors) Moderate difficulty   Carry a shopping bag or briefcase Mild difficulty   Wash your back Mild difficulty   Use a knife to cut food Mild difficulty   Recreational activities in which you take some force or impact through your arm, shoulder, or hand (golf, hammering, tennis) Moderate difficulty   During the past week, to what extent has your arm, shoulder or hand problem interfered with your normal social activities with family, friends, neighbors, or groups? Slightly   During the past week, to what extent has your arm, shoulder or hand problem limited your work or other regular daily activities Modererately   Arm, shoulder, or hand pain. Moderate   Tingling (pins and needles) in your arm, shoulder, or hand Moderate   Difficulty Sleeping Mild difficulty   DASH Score 38.64 %             [facility] Adult PT Treatment/Exercise - 05/27/16 0001      Self-Care   Self-Care Other Self-Care Comments   Other Self-Care Comments  Educated (verbal and demonstration) about proper vacuuming technique, moving her body with the vacuum and also using left as  well as right arm to hold the vacuum.     Therapeutic Activites    Therapeutic Activities Other Therapeutic Activities     Exercises   Exercises Shoulder     Shoulder Exercises: Standing   Other Standing Exercises instructed in self-neural tension stretch                PT Education -  05/27/16 1213    Education provided Yes   Education Details right UE neural tension stretch, proper body mechanics for vacuuming, how and where to get a compression sleeve and recommendation for type of sleeve   Person(s) Educated Patient   Methods Explanation;Demonstration;Verbal cues;Handout   Comprehension Verbalized understanding;Returned demonstration              Breast Clinic Goals - 03/11/16 1547      Patient will be able to verbalize understanding of pertinent lymphedema risk reduction practices relevant to her diagnosis specifically related to skin care.   Time 1   Period Days   Status Achieved     Patient will be able to return demonstrate and/or verbalize understanding of the post-op home exercise program related to regaining shoulder range of motion.   Time 1   Period Days   Status Achieved     Patient will be able to verbalize understanding of the importance of attending the postoperative After Breast Cancer Class for further lymphedema risk reduction education and therapeutic exercise.   Time 1   Period Days   Status Achieved          Long Term Clinic Goals - 05/27/16 1218      CC Long Term Goal  #1   Title Patient will be knowledgeable about where and how to obtain a compression sleeve for prophyactic use   Time 1   Period Days   Status Achieved     CC Long Term Goal  #2   Title Pt. will be knowledgeable about using good body mechanics and practices for vacuuming   Status Achieved     CC Long Term Goal  #3   Title Pt. will be knowledgeable about performing right UE neural tension stretch, and about gradually increasing her exercise program   Status  Achieved            Plan - 05/27/16 1214    Clinical Impression Statement This is a pleasant woman s/p lumpectomy and SLNB for right breast cancer, currently undergoing radiation.  She has had some discomfort in her right arm with certain activities, and also some intermittent numbness in the fifth digit and ulnar aspect of arm.  She has started back to exercising and wants to get a compression sleeve, having learned about that at the [facility] class a month ago.  Eval today is low complexity.   Rehab Potential Excellent   Clinical Impairments Affecting Rehab Potential none   PT Frequency One time visit   PT Treatment/Interventions ADLs/Self Care Home Management;Patient/family education;Therapeutic exercise   PT Next Visit Plan No follow up planned at this time; patient knows we are available to her if needed in the future.     PT Home Exercise Plan right UE neural tensin stretch, use good body mechanics for household chores   Consulted and Agree with Plan of Care Patient      Patient will benefit from skilled therapeutic intervention in order to improve the following deficits and impairments:  Pain, Decreased knowledge of precautions, Decreased knowledge of use of DME, Impaired flexibility  Visit Diagnosis: Aftercare following surgery for neoplasm - Plan: PT plan of care cert/re-cert  Pain in right arm - Plan: PT plan of care cert/re-cert     Problem List Patient Active Problem List   Diagnosis Date Noted  . Malignant neoplasm of lower-outer quadrant of right breast of female, estrogen receptor positive ([place]) 03/10/2016  . Food allergy 01/30/2015  . Abdominal pain 01/30/2015  [place] 05/27/2016, 12:23 PM  [place], [place], [address] Phone: [phone]   Fax:  [phone]  Name: Teresa Mitchell MRN: [id number] Date of Birth: 1955-11-25  Serafina Royals, PT 05/27/16 12:23 PM

## 2016-05-28 ENCOUNTER — Ambulatory Visit
Admission: RE | Admit: 2016-05-28 | Discharge: 2016-05-28 | Disposition: A | Discharge status: Home / Self Care | Payer: BLUE CROSS/BLUE SHIELD | Source: Ambulatory Visit | Attending: Radiation Oncology | Admitting: Radiation Oncology

## 2016-05-28 DIAGNOSIS — C50511 Malignant neoplasm of lower-outer quadrant of right female breast: Secondary | ICD-10-CM | POA: Diagnosis not present

## 2016-05-28 DIAGNOSIS — Z825 Family history of asthma and other chronic lower respiratory diseases: Secondary | ICD-10-CM | POA: Diagnosis not present

## 2016-05-28 DIAGNOSIS — Z170 Estrogen receptor positive status [ER+]: Secondary | ICD-10-CM | POA: Diagnosis not present

## 2016-05-28 DIAGNOSIS — Z9011 Acquired absence of right breast and nipple: Secondary | ICD-10-CM | POA: Diagnosis not present

## 2016-05-28 DIAGNOSIS — Z79899 Other long term (current) drug therapy: Secondary | ICD-10-CM | POA: Diagnosis not present

## 2016-05-28 DIAGNOSIS — Z8489 Family history of other specified conditions: Secondary | ICD-10-CM | POA: Diagnosis not present

## 2016-05-28 DIAGNOSIS — Z9889 Other specified postprocedural states: Secondary | ICD-10-CM | POA: Diagnosis not present

## 2016-05-28 DIAGNOSIS — Z510 Encounter for antineoplastic radiation therapy: Secondary | ICD-10-CM | POA: Diagnosis not present

## 2016-05-29 ENCOUNTER — Ambulatory Visit
Admission: RE | Admit: 2016-05-29 | Discharge: 2016-05-29 | Disposition: A | Discharge status: Home / Self Care | Payer: BLUE CROSS/BLUE SHIELD | Source: Ambulatory Visit | Attending: Radiation Oncology | Admitting: Radiation Oncology

## 2016-05-29 DIAGNOSIS — Z170 Estrogen receptor positive status [ER+]: Principal | ICD-10-CM

## 2016-05-29 DIAGNOSIS — Z825 Family history of asthma and other chronic lower respiratory diseases: Secondary | ICD-10-CM | POA: Diagnosis not present

## 2016-05-29 DIAGNOSIS — Z510 Encounter for antineoplastic radiation therapy: Secondary | ICD-10-CM | POA: Diagnosis not present

## 2016-05-29 DIAGNOSIS — C50511 Malignant neoplasm of lower-outer quadrant of right female breast: Secondary | ICD-10-CM

## 2016-05-29 DIAGNOSIS — Z79899 Other long term (current) drug therapy: Secondary | ICD-10-CM | POA: Diagnosis not present

## 2016-05-29 DIAGNOSIS — Z9011 Acquired absence of right breast and nipple: Secondary | ICD-10-CM | POA: Diagnosis not present

## 2016-05-29 DIAGNOSIS — Z9889 Other specified postprocedural states: Secondary | ICD-10-CM | POA: Diagnosis not present

## 2016-05-29 DIAGNOSIS — Z8489 Family history of other specified conditions: Secondary | ICD-10-CM | POA: Diagnosis not present

## 2016-05-29 MED ORDER — RADIAPLEXRX EX GEL
Freq: Once | CUTANEOUS | Status: AC
  Administered 2016-05-29: 12:00:00 via TOPICAL

## 2016-06-01 ENCOUNTER — Ambulatory Visit
Admission: RE | Admit: 2016-06-01 | Discharge: 2016-06-01 | Disposition: A | Discharge status: Home / Self Care | Payer: BLUE CROSS/BLUE SHIELD | Source: Ambulatory Visit | Attending: Radiation Oncology | Admitting: Radiation Oncology

## 2016-06-01 DIAGNOSIS — Z825 Family history of asthma and other chronic lower respiratory diseases: Secondary | ICD-10-CM | POA: Diagnosis not present

## 2016-06-01 DIAGNOSIS — Z510 Encounter for antineoplastic radiation therapy: Secondary | ICD-10-CM | POA: Diagnosis not present

## 2016-06-01 DIAGNOSIS — C50511 Malignant neoplasm of lower-outer quadrant of right female breast: Secondary | ICD-10-CM | POA: Diagnosis not present

## 2016-06-01 DIAGNOSIS — Z170 Estrogen receptor positive status [ER+]: Secondary | ICD-10-CM | POA: Diagnosis not present

## 2016-06-01 DIAGNOSIS — Z79899 Other long term (current) drug therapy: Secondary | ICD-10-CM | POA: Diagnosis not present

## 2016-06-01 DIAGNOSIS — Z9889 Other specified postprocedural states: Secondary | ICD-10-CM | POA: Diagnosis not present

## 2016-06-01 DIAGNOSIS — Z8489 Family history of other specified conditions: Secondary | ICD-10-CM | POA: Diagnosis not present

## 2016-06-01 DIAGNOSIS — Z9011 Acquired absence of right breast and nipple: Secondary | ICD-10-CM | POA: Diagnosis not present

## 2016-06-02 ENCOUNTER — Ambulatory Visit
Admission: RE | Admit: 2016-06-02 | Discharge: 2016-06-02 | Disposition: A | Discharge status: Home / Self Care | Payer: BLUE CROSS/BLUE SHIELD | Source: Ambulatory Visit | Attending: Radiation Oncology | Admitting: Radiation Oncology

## 2016-06-02 DIAGNOSIS — Z79899 Other long term (current) drug therapy: Secondary | ICD-10-CM | POA: Diagnosis not present

## 2016-06-02 DIAGNOSIS — Z9889 Other specified postprocedural states: Secondary | ICD-10-CM | POA: Diagnosis not present

## 2016-06-02 DIAGNOSIS — Z9011 Acquired absence of right breast and nipple: Secondary | ICD-10-CM | POA: Diagnosis not present

## 2016-06-02 DIAGNOSIS — Z8489 Family history of other specified conditions: Secondary | ICD-10-CM | POA: Diagnosis not present

## 2016-06-02 DIAGNOSIS — Z825 Family history of asthma and other chronic lower respiratory diseases: Secondary | ICD-10-CM | POA: Diagnosis not present

## 2016-06-02 DIAGNOSIS — C50511 Malignant neoplasm of lower-outer quadrant of right female breast: Secondary | ICD-10-CM | POA: Diagnosis not present

## 2016-06-02 DIAGNOSIS — Z170 Estrogen receptor positive status [ER+]: Secondary | ICD-10-CM | POA: Diagnosis not present

## 2016-06-02 DIAGNOSIS — Z510 Encounter for antineoplastic radiation therapy: Secondary | ICD-10-CM | POA: Diagnosis not present

## 2016-06-03 ENCOUNTER — Ambulatory Visit
Admission: RE | Admit: 2016-06-03 | Discharge: 2016-06-03 | Disposition: A | Discharge status: Home / Self Care | Payer: BLUE CROSS/BLUE SHIELD | Source: Ambulatory Visit | Attending: Radiation Oncology | Admitting: Radiation Oncology

## 2016-06-03 DIAGNOSIS — Z510 Encounter for antineoplastic radiation therapy: Secondary | ICD-10-CM | POA: Diagnosis not present

## 2016-06-03 DIAGNOSIS — Z79899 Other long term (current) drug therapy: Secondary | ICD-10-CM | POA: Diagnosis not present

## 2016-06-03 DIAGNOSIS — Z9889 Other specified postprocedural states: Secondary | ICD-10-CM | POA: Diagnosis not present

## 2016-06-03 DIAGNOSIS — Z8489 Family history of other specified conditions: Secondary | ICD-10-CM | POA: Diagnosis not present

## 2016-06-03 DIAGNOSIS — Z825 Family history of asthma and other chronic lower respiratory diseases: Secondary | ICD-10-CM | POA: Diagnosis not present

## 2016-06-03 DIAGNOSIS — Z9011 Acquired absence of right breast and nipple: Secondary | ICD-10-CM | POA: Diagnosis not present

## 2016-06-03 DIAGNOSIS — C50511 Malignant neoplasm of lower-outer quadrant of right female breast: Secondary | ICD-10-CM | POA: Diagnosis not present

## 2016-06-03 DIAGNOSIS — Z170 Estrogen receptor positive status [ER+]: Secondary | ICD-10-CM | POA: Diagnosis not present

## 2016-06-04 ENCOUNTER — Ambulatory Visit
Admission: RE | Admit: 2016-06-04 | Discharge: 2016-06-04 | Disposition: A | Discharge status: Home / Self Care | Payer: BLUE CROSS/BLUE SHIELD | Source: Ambulatory Visit | Attending: Radiation Oncology | Admitting: Radiation Oncology

## 2016-06-04 DIAGNOSIS — Z9011 Acquired absence of right breast and nipple: Secondary | ICD-10-CM | POA: Diagnosis not present

## 2016-06-04 DIAGNOSIS — Z825 Family history of asthma and other chronic lower respiratory diseases: Secondary | ICD-10-CM | POA: Diagnosis not present

## 2016-06-04 DIAGNOSIS — Z9889 Other specified postprocedural states: Secondary | ICD-10-CM | POA: Diagnosis not present

## 2016-06-04 DIAGNOSIS — Z170 Estrogen receptor positive status [ER+]: Secondary | ICD-10-CM | POA: Diagnosis not present

## 2016-06-04 DIAGNOSIS — Z8489 Family history of other specified conditions: Secondary | ICD-10-CM | POA: Diagnosis not present

## 2016-06-04 DIAGNOSIS — C50511 Malignant neoplasm of lower-outer quadrant of right female breast: Secondary | ICD-10-CM | POA: Diagnosis not present

## 2016-06-04 DIAGNOSIS — Z510 Encounter for antineoplastic radiation therapy: Secondary | ICD-10-CM | POA: Diagnosis not present

## 2016-06-04 DIAGNOSIS — Z79899 Other long term (current) drug therapy: Secondary | ICD-10-CM | POA: Diagnosis not present

## 2016-06-05 ENCOUNTER — Ambulatory Visit
Admission: RE | Admit: 2016-06-05 | Discharge: 2016-06-05 | Disposition: A | Discharge status: Home / Self Care | Payer: BLUE CROSS/BLUE SHIELD | Source: Ambulatory Visit | Attending: Radiation Oncology | Admitting: Radiation Oncology

## 2016-06-05 DIAGNOSIS — C50511 Malignant neoplasm of lower-outer quadrant of right female breast: Secondary | ICD-10-CM | POA: Diagnosis not present

## 2016-06-05 DIAGNOSIS — Z8489 Family history of other specified conditions: Secondary | ICD-10-CM | POA: Diagnosis not present

## 2016-06-05 DIAGNOSIS — Z510 Encounter for antineoplastic radiation therapy: Secondary | ICD-10-CM | POA: Diagnosis not present

## 2016-06-05 DIAGNOSIS — Z9011 Acquired absence of right breast and nipple: Secondary | ICD-10-CM | POA: Diagnosis not present

## 2016-06-05 DIAGNOSIS — Z79899 Other long term (current) drug therapy: Secondary | ICD-10-CM | POA: Diagnosis not present

## 2016-06-05 DIAGNOSIS — Z825 Family history of asthma and other chronic lower respiratory diseases: Secondary | ICD-10-CM | POA: Diagnosis not present

## 2016-06-05 DIAGNOSIS — Z9889 Other specified postprocedural states: Secondary | ICD-10-CM | POA: Diagnosis not present

## 2016-06-05 DIAGNOSIS — Z170 Estrogen receptor positive status [ER+]: Secondary | ICD-10-CM | POA: Diagnosis not present

## 2016-06-08 ENCOUNTER — Ambulatory Visit
Admission: RE | Admit: 2016-06-08 | Payer: BLUE CROSS/BLUE SHIELD | Source: Ambulatory Visit | Admitting: Radiation Oncology

## 2016-06-08 ENCOUNTER — Ambulatory Visit
Admission: RE | Admit: 2016-06-08 | Discharge: 2016-06-08 | Disposition: A | Discharge status: Home / Self Care | Payer: BLUE CROSS/BLUE SHIELD | Source: Ambulatory Visit | Attending: Radiation Oncology | Admitting: Radiation Oncology

## 2016-06-08 DIAGNOSIS — Z170 Estrogen receptor positive status [ER+]: Secondary | ICD-10-CM | POA: Diagnosis not present

## 2016-06-08 DIAGNOSIS — C50511 Malignant neoplasm of lower-outer quadrant of right female breast: Secondary | ICD-10-CM | POA: Diagnosis not present

## 2016-06-08 DIAGNOSIS — Z9011 Acquired absence of right breast and nipple: Secondary | ICD-10-CM | POA: Diagnosis not present

## 2016-06-08 DIAGNOSIS — Z8489 Family history of other specified conditions: Secondary | ICD-10-CM | POA: Diagnosis not present

## 2016-06-08 DIAGNOSIS — Z79899 Other long term (current) drug therapy: Secondary | ICD-10-CM | POA: Diagnosis not present

## 2016-06-08 DIAGNOSIS — Z9889 Other specified postprocedural states: Secondary | ICD-10-CM | POA: Diagnosis not present

## 2016-06-08 DIAGNOSIS — Z510 Encounter for antineoplastic radiation therapy: Secondary | ICD-10-CM | POA: Diagnosis not present

## 2016-06-08 DIAGNOSIS — Z825 Family history of asthma and other chronic lower respiratory diseases: Secondary | ICD-10-CM | POA: Diagnosis not present

## 2016-06-09 ENCOUNTER — Ambulatory Visit
Admission: RE | Admit: 2016-06-09 | Discharge: 2016-06-09 | Disposition: A | Discharge status: Home / Self Care | Payer: BLUE CROSS/BLUE SHIELD | Source: Ambulatory Visit | Attending: Radiation Oncology | Admitting: Radiation Oncology

## 2016-06-09 DIAGNOSIS — Z170 Estrogen receptor positive status [ER+]: Secondary | ICD-10-CM | POA: Diagnosis not present

## 2016-06-09 DIAGNOSIS — C50511 Malignant neoplasm of lower-outer quadrant of right female breast: Secondary | ICD-10-CM | POA: Diagnosis not present

## 2016-06-09 DIAGNOSIS — Z79899 Other long term (current) drug therapy: Secondary | ICD-10-CM | POA: Diagnosis not present

## 2016-06-09 DIAGNOSIS — Z9889 Other specified postprocedural states: Secondary | ICD-10-CM | POA: Diagnosis not present

## 2016-06-09 DIAGNOSIS — Z8489 Family history of other specified conditions: Secondary | ICD-10-CM | POA: Diagnosis not present

## 2016-06-09 DIAGNOSIS — Z825 Family history of asthma and other chronic lower respiratory diseases: Secondary | ICD-10-CM | POA: Diagnosis not present

## 2016-06-09 DIAGNOSIS — Z9011 Acquired absence of right breast and nipple: Secondary | ICD-10-CM | POA: Diagnosis not present

## 2016-06-09 DIAGNOSIS — Z510 Encounter for antineoplastic radiation therapy: Secondary | ICD-10-CM | POA: Diagnosis not present

## 2016-06-10 ENCOUNTER — Ambulatory Visit
Admission: RE | Admit: 2016-06-10 | Discharge: 2016-06-10 | Disposition: A | Discharge status: Home / Self Care | Payer: BLUE CROSS/BLUE SHIELD | Source: Ambulatory Visit | Attending: Radiation Oncology | Admitting: Radiation Oncology

## 2016-06-10 DIAGNOSIS — C50511 Malignant neoplasm of lower-outer quadrant of right female breast: Secondary | ICD-10-CM | POA: Diagnosis not present

## 2016-06-10 DIAGNOSIS — Z825 Family history of asthma and other chronic lower respiratory diseases: Secondary | ICD-10-CM | POA: Diagnosis not present

## 2016-06-10 DIAGNOSIS — Z9889 Other specified postprocedural states: Secondary | ICD-10-CM | POA: Diagnosis not present

## 2016-06-10 DIAGNOSIS — Z8489 Family history of other specified conditions: Secondary | ICD-10-CM | POA: Diagnosis not present

## 2016-06-10 DIAGNOSIS — Z170 Estrogen receptor positive status [ER+]: Secondary | ICD-10-CM | POA: Diagnosis not present

## 2016-06-10 DIAGNOSIS — Z9011 Acquired absence of right breast and nipple: Secondary | ICD-10-CM | POA: Diagnosis not present

## 2016-06-10 DIAGNOSIS — Z79899 Other long term (current) drug therapy: Secondary | ICD-10-CM | POA: Diagnosis not present

## 2016-06-10 DIAGNOSIS — Z510 Encounter for antineoplastic radiation therapy: Secondary | ICD-10-CM | POA: Diagnosis not present

## 2016-06-11 ENCOUNTER — Ambulatory Visit
Admission: RE | Admit: 2016-06-11 | Discharge: 2016-06-11 | Disposition: A | Discharge status: Home / Self Care | Payer: BLUE CROSS/BLUE SHIELD | Source: Ambulatory Visit | Attending: Radiation Oncology | Admitting: Radiation Oncology

## 2016-06-11 DIAGNOSIS — Z510 Encounter for antineoplastic radiation therapy: Secondary | ICD-10-CM | POA: Diagnosis not present

## 2016-06-11 DIAGNOSIS — Z825 Family history of asthma and other chronic lower respiratory diseases: Secondary | ICD-10-CM | POA: Diagnosis not present

## 2016-06-11 DIAGNOSIS — C50511 Malignant neoplasm of lower-outer quadrant of right female breast: Secondary | ICD-10-CM | POA: Diagnosis not present

## 2016-06-11 DIAGNOSIS — Z9011 Acquired absence of right breast and nipple: Secondary | ICD-10-CM | POA: Diagnosis not present

## 2016-06-11 DIAGNOSIS — Z170 Estrogen receptor positive status [ER+]: Secondary | ICD-10-CM | POA: Diagnosis not present

## 2016-06-11 DIAGNOSIS — Z79899 Other long term (current) drug therapy: Secondary | ICD-10-CM | POA: Diagnosis not present

## 2016-06-11 DIAGNOSIS — Z8489 Family history of other specified conditions: Secondary | ICD-10-CM | POA: Diagnosis not present

## 2016-06-11 DIAGNOSIS — Z9889 Other specified postprocedural states: Secondary | ICD-10-CM | POA: Diagnosis not present

## 2016-06-12 ENCOUNTER — Ambulatory Visit
Admission: RE | Admit: 2016-06-12 | Discharge: 2016-06-12 | Disposition: A | Discharge status: Home / Self Care | Payer: BLUE CROSS/BLUE SHIELD | Source: Ambulatory Visit | Attending: Radiation Oncology | Admitting: Radiation Oncology

## 2016-06-12 DIAGNOSIS — Z8489 Family history of other specified conditions: Secondary | ICD-10-CM | POA: Diagnosis not present

## 2016-06-12 DIAGNOSIS — Z9889 Other specified postprocedural states: Secondary | ICD-10-CM | POA: Diagnosis not present

## 2016-06-12 DIAGNOSIS — Z825 Family history of asthma and other chronic lower respiratory diseases: Secondary | ICD-10-CM | POA: Diagnosis not present

## 2016-06-12 DIAGNOSIS — Z170 Estrogen receptor positive status [ER+]: Secondary | ICD-10-CM | POA: Diagnosis not present

## 2016-06-12 DIAGNOSIS — Z510 Encounter for antineoplastic radiation therapy: Secondary | ICD-10-CM | POA: Diagnosis not present

## 2016-06-12 DIAGNOSIS — Z9011 Acquired absence of right breast and nipple: Secondary | ICD-10-CM | POA: Diagnosis not present

## 2016-06-12 DIAGNOSIS — C50511 Malignant neoplasm of lower-outer quadrant of right female breast: Secondary | ICD-10-CM | POA: Diagnosis not present

## 2016-06-12 DIAGNOSIS — Z79899 Other long term (current) drug therapy: Secondary | ICD-10-CM | POA: Diagnosis not present

## 2016-06-15 ENCOUNTER — Ambulatory Visit
Admission: RE | Admit: 2016-06-15 | Discharge: 2016-06-15 | Disposition: A | Discharge status: Home / Self Care | Payer: BLUE CROSS/BLUE SHIELD | Source: Ambulatory Visit | Attending: Radiation Oncology | Admitting: Radiation Oncology

## 2016-06-15 DIAGNOSIS — C50511 Malignant neoplasm of lower-outer quadrant of right female breast: Secondary | ICD-10-CM | POA: Diagnosis not present

## 2016-06-15 DIAGNOSIS — Z79899 Other long term (current) drug therapy: Secondary | ICD-10-CM | POA: Diagnosis not present

## 2016-06-15 DIAGNOSIS — Z9889 Other specified postprocedural states: Secondary | ICD-10-CM | POA: Diagnosis not present

## 2016-06-15 DIAGNOSIS — Z170 Estrogen receptor positive status [ER+]: Secondary | ICD-10-CM | POA: Diagnosis not present

## 2016-06-15 DIAGNOSIS — Z510 Encounter for antineoplastic radiation therapy: Secondary | ICD-10-CM | POA: Diagnosis not present

## 2016-06-15 DIAGNOSIS — Z9011 Acquired absence of right breast and nipple: Secondary | ICD-10-CM | POA: Diagnosis not present

## 2016-06-15 DIAGNOSIS — Z825 Family history of asthma and other chronic lower respiratory diseases: Secondary | ICD-10-CM | POA: Diagnosis not present

## 2016-06-15 DIAGNOSIS — Z8489 Family history of other specified conditions: Secondary | ICD-10-CM | POA: Diagnosis not present

## 2016-06-16 ENCOUNTER — Ambulatory Visit
Admission: RE | Admit: 2016-06-16 | Discharge: 2016-06-16 | Disposition: A | Discharge status: Home / Self Care | Payer: BLUE CROSS/BLUE SHIELD | Source: Ambulatory Visit | Attending: Radiation Oncology | Admitting: Radiation Oncology

## 2016-06-16 DIAGNOSIS — Z9011 Acquired absence of right breast and nipple: Secondary | ICD-10-CM | POA: Diagnosis not present

## 2016-06-16 DIAGNOSIS — Z8489 Family history of other specified conditions: Secondary | ICD-10-CM | POA: Diagnosis not present

## 2016-06-16 DIAGNOSIS — Z510 Encounter for antineoplastic radiation therapy: Secondary | ICD-10-CM | POA: Diagnosis not present

## 2016-06-16 DIAGNOSIS — C50511 Malignant neoplasm of lower-outer quadrant of right female breast: Secondary | ICD-10-CM | POA: Diagnosis not present

## 2016-06-16 DIAGNOSIS — Z79899 Other long term (current) drug therapy: Secondary | ICD-10-CM | POA: Diagnosis not present

## 2016-06-16 DIAGNOSIS — Z170 Estrogen receptor positive status [ER+]: Secondary | ICD-10-CM | POA: Diagnosis not present

## 2016-06-16 DIAGNOSIS — Z825 Family history of asthma and other chronic lower respiratory diseases: Secondary | ICD-10-CM | POA: Diagnosis not present

## 2016-06-16 DIAGNOSIS — Z9889 Other specified postprocedural states: Secondary | ICD-10-CM | POA: Diagnosis not present

## 2016-06-16 NOTE — Assessment & Plan Note (Signed)
Right lumpectomy 04/02/2016: IDC grade 2, 0.7 cm, margins negative, one benign lymph node, T1b N0 stage IA, ER 90%, PR 20%, HER-2 negative, Ki-67 12% Oncotype Dx: 15 (low risk): 10% ROR Adj XRT: 05/21/16 to 06/17/16  Plan: Adj Anti estrogen therapy with Anastrozole 1 mg daily We discussed the risks and benefits of anti-estrogen therapy with aromatase inhibitors. These include but not limited to insomnia, hot flashes, mood changes, vaginal dryness, bone density loss, and weight gain. We strongly believe that the benefits far outweigh the risks. Patient understands these risks and consented to starting treatment. Planned treatment duration is 5 years.  RTC in 3 months

## 2016-06-17 ENCOUNTER — Encounter: Payer: Self-pay | Admitting: Hematology and Oncology

## 2016-06-17 ENCOUNTER — Ambulatory Visit (HOSPITAL_BASED_OUTPATIENT_CLINIC_OR_DEPARTMENT_OTHER): Payer: BLUE CROSS/BLUE SHIELD | Admitting: Hematology and Oncology

## 2016-06-17 ENCOUNTER — Ambulatory Visit
Admission: RE | Admit: 2016-06-17 | Discharge: 2016-06-17 | Disposition: A | Discharge status: Home / Self Care | Payer: BLUE CROSS/BLUE SHIELD | Source: Ambulatory Visit | Attending: Radiation Oncology | Admitting: Radiation Oncology

## 2016-06-17 DIAGNOSIS — Z510 Encounter for antineoplastic radiation therapy: Secondary | ICD-10-CM | POA: Diagnosis not present

## 2016-06-17 DIAGNOSIS — Z9889 Other specified postprocedural states: Secondary | ICD-10-CM | POA: Diagnosis not present

## 2016-06-17 DIAGNOSIS — C50919 Malignant neoplasm of unspecified site of unspecified female breast: Secondary | ICD-10-CM | POA: Diagnosis not present

## 2016-06-17 DIAGNOSIS — Z170 Estrogen receptor positive status [ER+]: Secondary | ICD-10-CM | POA: Diagnosis not present

## 2016-06-17 DIAGNOSIS — Z825 Family history of asthma and other chronic lower respiratory diseases: Secondary | ICD-10-CM | POA: Diagnosis not present

## 2016-06-17 DIAGNOSIS — C50511 Malignant neoplasm of lower-outer quadrant of right female breast: Secondary | ICD-10-CM | POA: Diagnosis not present

## 2016-06-17 DIAGNOSIS — Z8489 Family history of other specified conditions: Secondary | ICD-10-CM | POA: Diagnosis not present

## 2016-06-17 DIAGNOSIS — Z79899 Other long term (current) drug therapy: Secondary | ICD-10-CM | POA: Diagnosis not present

## 2016-06-17 DIAGNOSIS — Z9011 Acquired absence of right breast and nipple: Secondary | ICD-10-CM | POA: Diagnosis not present

## 2016-06-17 MED ORDER — ANASTROZOLE 1 MG PO TABS: 1.0000 mg | ORAL_TABLET | Freq: Every day | ORAL | 3 refills | Status: DC

## 2016-06-17 NOTE — Progress Notes (Signed)
Patient Care Team: Kelton Pillar, MD as PCP - General (Family Medicine) Paula Compton, MD as Consulting Physician (Obstetrics and Gynecology) Erroll Luna, MD as Consulting Physician (General Surgery) Nicholas Lose, MD as Consulting Physician (Hematology and Oncology) Kyung Rudd, MD as Consulting Physician (Radiation Oncology)  DIAGNOSIS:  Encounter Diagnosis  Name Primary?  . Malignant neoplasm of lower-outer quadrant of right breast of female, estrogen receptor positive ([place])     SUMMARY OF ONCOLOGIC HISTORY:   Malignant neoplasm of lower-outer quadrant of right breast of female, estrogen receptor positive ([place])   03/02/2016 Initial Diagnosis    Right breast biopsy 8:00: Invasive ductal carcinoma grade 1-2, ER 90%, PR 20%, Ki-67 12%, HER-2 negative, screening detected right breast mass LOQ posterior depth 8:00 position 5 mm, axilla negative, T1 1 N0 stage IA clinical stage      04/02/2016 Surgery    Right lumpectomy: IDC grade 2, 0.7 cm, margins negative, one benign lymph node, T1b N0 stage IA, ER 90%, PR 20%, HER-2 negative, Ki-67 12%      04/16/2016 Oncotype testing    Recurrence score: 15; ROR: 10%      05/21/2016 - 06/16/2016 Radiation Therapy    Adj XRT       CHIEF COMPLIANT: Today is last day of radiation  INTERVAL HISTORY: Teresa Mitchell is a 61 year old with above-mentioned history right breast cancer with lumpectomy and adjuvant radiation. Today is last of radiation. For the past couple of days she has had worsening radiation dermatitis. She is here to discuss subsequent treatment plan.  REVIEW OF SYSTEMS:   Constitutional: Denies fevers, chills or abnormal weight loss Eyes: Denies blurriness of vision Ears, nose, mouth, throat, and face: Denies mucositis or sore throat Respiratory: Denies cough, dyspnea or wheezes Cardiovascular: Denies palpitation, chest discomfort Gastrointestinal:  Denies nausea, heartburn or change in bowel habits Skin: Denies  abnormal skin rashes Lymphatics: Denies new lymphadenopathy or easy bruising Neurological:Denies numbness, tingling or new weaknesses Behavioral/Psych: Mood is stable, no new changes  Extremities: No lower extremity edema Breast: Radiation dermatitis All other systems were reviewed with the patient and are negative.  I have reviewed the past medical history, past surgical history, social history and family history with the patient and they are unchanged from previous note.  ALLERGIES:  has No Known Allergies.  MEDICATIONS:  Current Outpatient Prescriptions  Medication Sig Dispense Refill  . Biotin 1 MG CAPS Take by mouth.    . diphenhydramine-acetaminophen (TYLENOL PM) 25-500 MG TABS tablet Take 1 tablet by mouth at bedtime as needed.    . Fish Oil OIL by Does not apply route.    Marland Kitchen MAGNESIUM ASPARTATE PO Take 200 mg by mouth 2 (two) times daily.     Marland Kitchen oxyCODONE-acetaminophen (ROXICET) 5-325 MG tablet Take 1-2 tablets by mouth every 4 (four) hours as needed. (Patient not taking: Reported on 05/07/2016) 15 tablet 0  . pravastatin (PRAVACHOL) 20 MG tablet Take 20 mg by mouth daily.    . Probiotic Product (PROBIOTIC-10 PO) Take 1 tablet by mouth.    . vitamin B-12 (CYANOCOBALAMIN) 100 MCG tablet Take 3,000 mcg by mouth daily.      No current facility-administered medications for this visit.     PHYSICAL EXAMINATION: ECOG PERFORMANCE STATUS: 1 - Symptomatic but completely ambulatory  There were no vitals filed for this visit. There were no vitals filed for this visit.  GENERAL:alert, no distress and comfortable SKIN: skin color, texture, turgor are normal, no rashes or significant lesions EYES: normal,  Conjunctiva are pink and non-injected, sclera clear OROPHARYNX:no exudate, no erythema and lips, buccal mucosa, and tongue normal  NECK: supple, thyroid normal size, non-tender, without nodularity LYMPH:  no palpable lymphadenopathy in the cervical, axillary or inguinal LUNGS: clear to  auscultation and percussion with normal breathing effort HEART: regular rate & rhythm and no murmurs and no lower extremity edema ABDOMEN:abdomen soft, non-tender and normal bowel sounds MUSCULOSKELETAL:no cyanosis of digits and no clubbing  NEURO: alert & oriented x 3 with fluent speech, no focal motor/sensory deficits EXTREMITIES: No lower extremity edema  LABORATORY DATA:  I have reviewed the data as listed   Chemistry      Component Value Date/Time   NA 141 03/11/2016 1249   K 3.9 03/11/2016 1249   CO2 26 03/11/2016 1249   BUN 9.1 03/11/2016 1249   CREATININE 0.7 03/11/2016 1249      Component Value Date/Time   CALCIUM 9.9 03/11/2016 1249   ALKPHOS 60 03/11/2016 1249   AST 21 03/11/2016 1249   ALT 25 03/11/2016 1249   BILITOT 0.95 03/11/2016 1249       Lab Results  Component Value Date   WBC 5.0 03/11/2016   HGB 13.9 03/11/2016   HCT 41.8 03/11/2016   MCV 89.8 03/11/2016   PLT 177 03/11/2016   NEUTROABS 2.8 03/11/2016    ASSESSMENT & PLAN:  Malignant neoplasm of lower-outer quadrant of right breast of female, estrogen receptor positive ([place]) Right lumpectomy 04/02/2016: IDC grade 2, 0.7 cm, margins negative, one benign lymph node, T1b N0 stage IA, ER 90%, PR 20%, HER-2 negative, Ki-67 12% Oncotype Dx: 15 (low risk): 10% ROR Adj XRT: 05/21/16 to 06/17/16  Plan: Adj Anti estrogen therapy with Anastrozole 1 mg daily Will be started 07/04/2016 We discussed the risks and benefits of anti-estrogen therapy with aromatase inhibitors. These include but not limited to insomnia, hot flashes, mood changes, vaginal dryness, bone density loss, and weight gain. We strongly believe that the benefits far outweigh the risks. Patient understands these risks and consented to starting treatment. Planned treatment duration is 5 years.  RTC in 3 months   I spent 25 minutes talking to the patient of which more than half was spent in counseling and coordination of care.  No orders of the  defined types were placed in this encounter.  The patient has a good understanding of the overall plan. she agrees with it. she will call with any problems that may develop before the next visit here.   Rulon Eisenmenger, MD 06/17/16

## 2016-06-18 ENCOUNTER — Encounter: Payer: Self-pay | Admitting: *Deleted

## 2016-06-23 ENCOUNTER — Encounter: Payer: Self-pay | Admitting: Radiation Oncology

## 2016-06-23 ENCOUNTER — Telehealth: Payer: Self-pay | Admitting: *Deleted

## 2016-06-23 NOTE — Progress Notes (Signed)
  Radiation Oncology         [phone] ________________________________  Name: Teresa Mitchell MRN: [id number]  Date: 06/23/2016  DOB: April 25, 1955  End of Treatment Note  Diagnosis: Stage IA cT1aN0, ER/PR positive, grade 1-2 invasive ductal carcinoma of the right breast     Indication for treatment: Curative    Radiation treatment dates:  05/21/16-06/17/16  Site/dose:  1) Right breast/ 42.5 Gy in 17 fractions   2) Right breast boost/ 7.5 Gy in 3 fractions  Beams/energy:  1) 3D/ 6X    2) Special teletherapy treatment/ 15E  Narrative: The patient tolerated radiation treatment relatively well.  Patient exhibited mild erythema without desquamation to the treatment area.  Plan: The patient has completed radiation treatment. The patient will return to radiation oncology clinic for routine followup in one month. I advised them to call or return sooner if they have any questions or concerns related to their recovery or treatment.  ------------------------------------------------  Jodelle Gross, MD, PhD  This document serves as a record of services personally performed by Kyung Rudd, MD. It was created on his behalf by Bethann Humble, a trained medical scribe. The creation of this record is based on the scribe's personal observations and the provider's statements to them. This document has been checked and approved by the attending provider.

## 2016-06-23 NOTE — Telephone Encounter (Signed)
Returned voice message left on phone, from triage ,  patient stated blister  Near nipple, should she continue  Putting biafine on that area or leave it alone?' Informd not to put cream on the actula blister,if it opens up or pops you can put neosporin therer,  Patient thanked this RN for return call 3:10 PM

## 2016-06-30 DIAGNOSIS — C50919 Malignant neoplasm of unspecified site of unspecified female breast: Secondary | ICD-10-CM | POA: Diagnosis not present

## 2016-08-03 ENCOUNTER — Encounter: Payer: Self-pay | Admitting: Adult Health

## 2016-08-03 ENCOUNTER — Ambulatory Visit (HOSPITAL_BASED_OUTPATIENT_CLINIC_OR_DEPARTMENT_OTHER): Payer: BLUE CROSS/BLUE SHIELD | Admitting: Adult Health

## 2016-08-03 VITALS — BP 105/56 | HR 73 | Temp 98.0°F | Resp 17 | Ht 61.0 in | Wt 139.5 lb

## 2016-08-03 DIAGNOSIS — Z79811 Long term (current) use of aromatase inhibitors: Secondary | ICD-10-CM

## 2016-08-03 DIAGNOSIS — C50511 Malignant neoplasm of lower-outer quadrant of right female breast: Secondary | ICD-10-CM | POA: Diagnosis not present

## 2016-08-03 DIAGNOSIS — Z170 Estrogen receptor positive status [ER+]: Secondary | ICD-10-CM | POA: Diagnosis not present

## 2016-08-03 DIAGNOSIS — E2839 Other primary ovarian failure: Secondary | ICD-10-CM

## 2016-08-03 NOTE — Progress Notes (Signed)
CLINIC:  Survivorship   REASON FOR VISIT:  Routine follow-up post-treatment for a recent history of breast cancer.  BRIEF ONCOLOGIC HISTORY:    Malignant neoplasm of lower-outer quadrant of right breast of female, estrogen receptor positive ([place])   03/02/2016 Initial Diagnosis    Right breast biopsy 8:00: Invasive ductal carcinoma grade 1-2, ER 90%, PR 20%, Ki-67 12%, HER-2 negative, screening detected right breast mass LOQ posterior depth 8:00 position 5 mm, axilla negative, T1 1 N0 stage IA clinical stage      04/02/2016 Surgery    Right lumpectomy ([place]): IDC grade 2, 0.7 cm, margins negative, one benign lymph node, T1b N0 stage IA, ER 90%, PR 20%, HER-2 negative, Ki-67 12%      04/16/2016 Oncotype testing    Recurrence score: 15; ROR: 10%      05/21/2016 - 06/17/2016 Radiation Therapy    Adj XRT (moody):  1) Right breast/ 42.5 Gy in 17 fractions 2) Right breast boost/ 7.5 Gy in 3 fractions      06/2016 -  Anti-estrogen oral therapy    Anastrozole '1mg'$  daily       INTERVAL HISTORY:  Teresa Mitchell presents to the [facility] today for our initial meeting to review her survivorship care plan detailing her treatment course for breast cancer, as well as monitoring long-term side effects of that treatment, education regarding health maintenance, screening, and overall wellness and health promotion.     Overall, Teresa Mitchell reports feeling quite well.  She is taking Anastrozole daily and is tolerating it well.  She denies any issues with the Anastrozole thus far.      REVIEW OF SYSTEMS:  Review of Systems  Constitutional: Negative for appetite change, chills, fatigue, fever and unexpected weight change.  HENT:   Negative for hearing loss and lump/mass.   Eyes: Negative for eye problems and icterus.  Respiratory: Negative for chest tightness, cough and shortness of breath.   Cardiovascular: Negative for chest pain, leg swelling and palpitations.  Gastrointestinal:  Negative for abdominal distention and abdominal pain.  Endocrine: Negative for hot flashes.  Genitourinary: Negative for difficulty urinating.   Musculoskeletal: Negative for arthralgias.  Skin: Negative for itching and rash.  Neurological: Negative for dizziness, extremity weakness and headaches.  Hematological: Negative for adenopathy. Does not bruise/bleed easily.  Psychiatric/Behavioral: Negative for depression. The patient is not nervous/anxious.   Breast: Denies any new nodularity, masses, tenderness, nipple changes, or nipple discharge.      ONCOLOGY TREATMENT TEAM:  1. Surgeon:  Dr. Brantley Stage at [facility] 2. Medical Oncologist: Dr. Lindi Adie  3. Radiation Oncologist: Dr. Lisbeth Renshaw    PAST MEDICAL/SURGICAL HISTORY:  Past Medical History:  Diagnosis Date  . Breast cancer of lower-outer quadrant of right female breast ([place])   . Plantar fasciitis    Past Surgical History:  Procedure Laterality Date  . CESAREAN SECTION    . FEMUR FRACTURE SURGERY Left   . RADIOACTIVE SEED GUIDED MASTECTOMY WITH AXILLARY SENTINEL LYMPH NODE BIOPSY Right 04/02/2016   Procedure: RADIOACTIVE SEED GUIDED PARTIAL MASTECTOMY WITH AXILLARY SENTINEL LYMPH NODE BIOPSY;  Surgeon: Erroll Luna, MD;  Location: [place];  Service: General;  Laterality: Right;  . TONSILLECTOMY       ALLERGIES:  Allergies  Allergen Reactions  . No Known Allergies      CURRENT MEDICATIONS:  Outpatient Encounter Prescriptions as of 08/03/2016  Medication Sig  . ALPRAZolam (XANAX) 0.25 MG tablet alprazolam 0.25 mg tablet  . anastrozole (ARIMIDEX) 1  MG tablet Take 1 tablet (1 mg total) by mouth daily.  . Biotin 1 MG CAPS Take by mouth.  . diphenhydramine-acetaminophen (TYLENOL PM) 25-500 MG TABS tablet Take 1 tablet by mouth at bedtime as needed.  . Fish Oil OIL by Does not apply route.  Marland Kitchen MAGNESIUM ASPARTATE PO Take 200 mg by mouth 2 (two) times daily.   Marland Kitchen oxyCODONE-acetaminophen (ROXICET)  5-325 MG tablet Take 1-2 tablets by mouth every 4 (four) hours as needed.  . pravastatin (PRAVACHOL) 20 MG tablet Take 20 mg by mouth daily.  . Probiotic Product (PROBIOTIC-10 PO) Take 1 tablet by mouth.  . vitamin B-12 (CYANOCOBALAMIN) 100 MCG tablet Take 3,000 mcg by mouth daily.    No facility-administered encounter medications on file as of 08/03/2016.      ONCOLOGIC FAMILY HISTORY:  Family History  Problem Relation Age of Onset  . Asthma Maternal Grandfather   . Migraines Maternal Grandmother   . Melanoma Maternal Grandmother   . Allergic rhinitis Neg Hx   . Angioedema Neg Hx   . Atopy Neg Hx   . Eczema Neg Hx   . Immunodeficiency Neg Hx   . Urticaria Neg Hx       SOCIAL HISTORY:  Teresa Mitchell is married and lives with her husband in [place], [place].  She has 3 children and two live in [place], and one lives in [place].  Ms. Teresa Mitchell is currently retired.  She denies any current or history of tobacco, alcohol, or illicit drug use.     PHYSICAL EXAMINATION:  Vital Signs:   Vitals:   08/03/16 1310  BP: (!) 105/56  Pulse: 73  Resp: 17  Temp: 98 F (36.7 C)   Filed Weights   08/03/16 1310  Weight: 139 lb 8 oz (63.3 kg)   General: Well-nourished, well-appearing female in no acute distress.  She is unaccompanied today.   HEENT: Head is normocephalic.  Pupils equal and reactive to light. Conjunctivae clear without exudate.  Sclerae anicteric. Oral mucosa is pink, moist.  Oropharynx is pink without lesions or erythema.  Lymph: No cervical, supraclavicular, or infraclavicular lymphadenopathy noted on palpation.  Cardiovascular: Regular rate and rhythm.Marland Kitchen Respiratory: Clear to auscultation bilaterally. Chest expansion symmetric; breathing non-labored.  GI: Abdomen soft and round; non-tender, non-distended. Bowel sounds normoactive.  GU: Deferred.  Neuro: No focal deficits. Steady gait.  Psych: Mood and affect normal and appropriate for situation.    Extremities: No edema. MSK: No focal spinal tenderness to palpation.  Full range of motion in bilateral upper extremities Skin: Warm and dry.  LABORATORY DATA:  None for this visit.  DIAGNOSTIC IMAGING:  None for this visit.     ASSESSMENT AND PLAN:  Ms.. Stannard is a pleasant 61 y.o. female with Stage IA right breast invasive ductal carcinoma, ER+/PR+/HER2-, diagnosed in 02/2016, treated with lumpectomy, adjuvant radiation therapy, and anti-estrogen therapy with Anastrozole beginning in 06/2016.  She presents to the Survivorship Clinic for our initial meeting and routine follow-up post-completion of treatment for breast cancer.    1. Stage IA right breast cancer:  Ms. Tango is continuing to recover from definitive treatment for breast cancer. She will follow-up with her medical oncologist, Dr. Lindi Adie in 09/2016 with history and physical exam per surveillance protocol.  She will continue her anti-estrogen therapy with Anastrozole. Thus far, she is tolerating the Anastrozole well, with minimal side effects. She was instructed to make Dr. Lindi Adie or myself aware if she begins to experience any worsening side  effects of the medication and I could see her back in clinic to help manage those side effects, as needed. Today, a comprehensive survivorship care plan and treatment summary was reviewed with the patient today detailing her breast cancer diagnosis, treatment course, potential late/long-term effects of treatment, appropriate follow-up care with recommendations for the future, and patient education resources.  A copy of this summary, along with a letter will be sent to the patient's primary care provider via mail/fax/In Basket message after today's visit.    2. Bone health:  Given Ms. Dominique's age/history of breast cancer and her current treatment regimen including anti-estrogen therapy with Anastrozole, she is at risk for bone demineralization.  Her last DEXA scan was a couple of years ago, and  she thinks that her bone density was low.  She cannot remember what her results were, or who did the testing.  For this reason, I went ahead and ordered another bone density to be done when possible.  In the meantime, she was encouraged to increase her consumption of foods rich in calcium, as well as increase her weight-bearing activities.  She was given education on specific activities to promote bone health.  3. Cancer screening:  Due to Ms. Ramroop's history and her age, she should receive screening for skin cancers, colon cancer, and gynecologic cancers.  The information and recommendations are listed on the patient's comprehensive care plan/treatment summary and were reviewed in detail with the patient.    4. Health maintenance and wellness promotion: Ms. Ishikawa was encouraged to consume 5-7 servings of fruits and vegetables per day. We reviewed the "Nutrition Rainbow" handout, as well as the handout "Take Control of Your Health and Reduce Your Cancer Risk" from the [place].  She was also encouraged to engage in moderate to vigorous exercise for 30 minutes per day most days of the week. We discussed the [employer] fitness program, which is designed for cancer survivors to help them become more physically fit after cancer treatments.  She was instructed to limit her alcohol consumption and continue to abstain from tobacco use.     5. Support services/counseling: It is not uncommon for this period of the patient's cancer care trajectory to be one of many emotions and stressors.  We discussed an opportunity for her to participate in the next session of [facility] ("Finding Your New Normal") support group series designed for patients after they have completed treatment.   Ms. Kroeker was encouraged to take advantage of our many other support services programs, support groups, and/or counseling in coping with her new life as a cancer survivor after completing anti-cancer treatment.  She was  offered support today through active listening and expressive supportive counseling.  She was given information regarding our available services and encouraged to contact me with any questions or for help enrolling in any of our support group/programs.    Dispo:   -Return to cancer center in 09/2016 for follow up with Dr. Lindi Adie  -Mammogram due in 01/2017 -Follow up with surgery in 02/2017 -Bone Density testing next appt available -She is welcome to return back to the Survivorship Clinic at any time; no additional follow-up needed at this time.  -Consider referral back to survivorship as a long-term survivor for continued surveillance  A total of (30) minutes of face-to-face time was spent with this patient with greater than 50% of that time in counseling and care-coordination.   Gardenia Phlegm, [place] [phone]  Note: PRIMARY CARE PROVIDER Kelton Pillar, [place] [phone]

## 2016-08-10 ENCOUNTER — Ambulatory Visit
Admission: RE | Admit: 2016-08-10 | Discharge: 2016-08-10 | Disposition: A | Discharge status: Home / Self Care | Payer: BLUE CROSS/BLUE SHIELD | Source: Ambulatory Visit | Attending: Adult Health | Admitting: Adult Health

## 2016-08-10 DIAGNOSIS — E2839 Other primary ovarian failure: Secondary | ICD-10-CM

## 2016-08-10 DIAGNOSIS — M810 Age-related osteoporosis without current pathological fracture: Secondary | ICD-10-CM | POA: Diagnosis not present

## 2016-08-13 ENCOUNTER — Ambulatory Visit
Admission: RE | Admit: 2016-08-13 | Discharge: 2016-08-13 | Disposition: A | Discharge status: Home / Self Care | Payer: BLUE CROSS/BLUE SHIELD | Source: Ambulatory Visit | Attending: Radiation Oncology | Admitting: Radiation Oncology

## 2016-08-13 ENCOUNTER — Encounter: Payer: Self-pay | Admitting: Radiation Oncology

## 2016-08-13 VITALS — BP 115/66 | HR 52 | Temp 97.8°F | Resp 18 | Ht 61.0 in | Wt 135.8 lb

## 2016-08-13 DIAGNOSIS — Z79899 Other long term (current) drug therapy: Secondary | ICD-10-CM | POA: Insufficient documentation

## 2016-08-13 DIAGNOSIS — Z170 Estrogen receptor positive status [ER+]: Secondary | ICD-10-CM | POA: Diagnosis not present

## 2016-08-13 DIAGNOSIS — C50911 Malignant neoplasm of unspecified site of right female breast: Secondary | ICD-10-CM | POA: Diagnosis not present

## 2016-08-13 DIAGNOSIS — M8580 Other specified disorders of bone density and structure, unspecified site: Secondary | ICD-10-CM | POA: Insufficient documentation

## 2016-08-13 DIAGNOSIS — C50511 Malignant neoplasm of lower-outer quadrant of right female breast: Secondary | ICD-10-CM

## 2016-08-13 DIAGNOSIS — L299 Pruritus, unspecified: Secondary | ICD-10-CM | POA: Diagnosis not present

## 2016-08-13 NOTE — Progress Notes (Signed)
Radiation Oncology         [phone] ________________________________  Name: Teresa Mitchell MRN: [id number]  Date: 08/13/2016  DOB: 09/19/1955  Post Treatment Note  CC: Kelton Pillar, MD  Nicholas Lose, MD  Diagnosis:   Stage IA pT1bN0, ER/PR positive, grade 2 invasive ductal carcinoma of the right breast     Interval Since Last Radiation:  8 weeks   05/21/16-06/17/16: 1. Right breast/ 42.5 Gy in 17 fractions 2. Right breast boost/ 7.5 Gy in 3 fractions  Narrative:  The patient returns today for routine follow-up. During treatment she did very well with radiotherapy and did not have significant desquamation.                             On review of systems, the patient states she is feeling well overall. She is moving forward to be stripped that is coming out. She tells me that she had a bone mineral density scan performed yesterday and was told the results reveal osteopenia. She reports that she started having some dizziness when taking a ribbon X during the day. She subsequently started taking it at night which seems to agree with her. She has not noticed any additional episodes of dizziness. She also describes a sensation of itching along her posterior thorax, and describes that this is intermittent, she denies any difficulty with blisters, or lesions of the skin. She is not currently taking any vitamin D or calcium. She denies any concerns with her skin. No other complaints or verbalized.  ALLERGIES:  is allergic to no known allergies.  Meds: Current Outpatient Prescriptions  Medication Sig Dispense Refill  . anastrozole (ARIMIDEX) 1 MG tablet Take 1 tablet (1 mg total) by mouth daily. 90 tablet 3  . Biotin 1 MG CAPS Take by mouth.    . diphenhydramine-acetaminophen (TYLENOL PM) 25-500 MG TABS tablet Take 1 tablet by mouth at bedtime as needed.    . Fish Oil OIL by Does not apply route.    Marland Kitchen MAGNESIUM ASPARTATE PO Take 200 mg by mouth 2 (two) times daily.     . pravastatin  (PRAVACHOL) 20 MG tablet Take 20 mg by mouth daily.    . Turmeric 500 MG CAPS Take 500 mg by mouth every morning.    . vitamin B-12 (CYANOCOBALAMIN) 100 MCG tablet Take 3,000 mcg by mouth daily.     Marland Kitchen ALPRAZolam (XANAX) 0.25 MG tablet alprazolam 0.25 mg tablet    . oxyCODONE-acetaminophen (ROXICET) 5-325 MG tablet Take 1-2 tablets by mouth every 4 (four) hours as needed. (Patient not taking: Reported on 08/13/2016) 15 tablet 0   No current facility-administered medications for this encounter.     Physical Findings:  height is 5\' 1"  (1.549 m) and weight is 135 lb 12.8 oz (61.6 kg). Her oral temperature is 97.8 F (36.6 C). Her blood pressure is 115/66 and her pulse is 52 (abnormal). Her respiration is 18 and oxygen saturation is 100%.  Pain Assessment Pain Score: 0-No pain/10 In general this is a well appearing Caucasian female in no acute distress. She's alert and oriented x4 and appropriate throughout the examination. Cardiopulmonary assessment is negative for acute distress and she exhibits normal effort. The right breast was examined and reveals no evidence of desquamation, or hyperpigmentation. Posterior thorax is also assessed and does not reveal any evidence of blistering or visible lesions.    Lab Findings: Lab Results  Component Value Date  WBC 5.0 03/11/2016   HGB 13.9 03/11/2016   HCT 41.8 03/11/2016   MCV 89.8 03/11/2016   PLT 177 03/11/2016     Radiographic Findings: Dg Bone Density  Result Date: 08/10/2016 EXAM: DUAL X-RAY ABSORPTIOMETRY (DXA) FOR BONE MINERAL DENSITY IMPRESSION: Referring Physician:  Charlestine Massed CA[facility] PATIENT: Name: Teresa Mitchell, Teresa Mitchell Patient ID: [id number] Birth Date: [birth year] Height: 60.0 in. Sex: Female Measured: 08/10/2016 Weight: 135.6 lbs. Indications: Anastrazole, Breast Cancer History, Caucasian, Estrogen Deficient, Postmenopausal Fractures: None Treatments: Hormone Therapy For Cancer ASSESSMENT: The BMD measured at Femur Neck is 0.691 g/cm2  with a T-score of -2.5. This patient is considered osteoporotic according to [facility]) criteria. Left hip could not be evaluated due to surgical hardware. Site Region Measured Date Measured Age YA BMD Significant CHANGE T-score Right Femur Neck   7/10/[age]    62.4         -2.5    0.691 g/cm2 AP Spine    L1-L4  7/10/2016    62.4         -2.0    0.943 g/cm2 World Health Organization [facility]) criteria for post-menopausal, Caucasian Women: Normal       T-score at or above -1 SD Osteopenia   T-score between -1 and -2.5 SD Osteoporosis T-score at or below -2.5 SD RECOMMENDATION: [place] recommends that FDA-approved medical therapies be considered in postmenopausal women and men age 35 or older with a: 1. Hip or vertebral (clinical or morphometric) fracture. 2. T-score of <-2.5 at the spine or hip. 3. Ten-year fracture probability by FRAX of 3% or greater for hip fracture or 20% or greater for major osteoporotic fracture. All treatment decisions require clinical judgment and consideration of individual patient factors, including patient preferences, co-morbidities, previous drug use, risk factors not captured in the FRAX model (e.g. falls, vitamin D deficiency, increased bone turnover, interval significant decline in bone density) and possible under - or over-estimation of fracture risk by FRAX. All patients should ensure an adequate intake of dietary calcium (1200 mg/d) and vitamin D (800 IU daily) unless contraindicated. FOLLOW-UP: People with diagnosed cases of osteoporosis or at high risk for fracture should have regular bone mineral density tests. For patients eligible for Medicare, routine testing is allowed once every 2 years. The testing frequency can be increased to one year for patients who have rapidly progressing disease, those who are receiving or discontinuing medical therapy to restore bone mass, or have additional risk factors. I have reviewed this report, and  agree with the above findings. [facility] Radiology Electronically Signed   By: Earle Gell M.D.   On: 08/10/2016 10:41    Impression/Plan: 1.  Stage IA pT1bN0, ER/PR positive, grade 2 invasive ductal carcinoma of the right breast.  The patient has been doing well since completion of radiotherapy. We discussed that we would be happy to continue to follow her as needed, but she will also continue to follow up with Dr. Lindi Adie in medical oncology. She will continue her Arimidex at nighttime until she sees them. She was counseled on skin care as well as measures to avoid sun exposure to this area.  2. Osteopenia. The patient was advised to begin supplementation of vitamin D to take a total of 1000 IUs per day. She is also counseled on calcium 600 mg twice daily. She will await additional recommendations from Dr. Lindi Adie. 3. Itching sensation of the posterior thorax. This could be due to disruption since surgery, and she is going to consider  taking vitamin B6 to alleviate some of her symptoms. She's encouraged to let us know if this progresses or she sees clustered areas in which case we would reevaluate this.    Carola Rhine, PAC

## 2016-08-17 ENCOUNTER — Other Ambulatory Visit: Payer: Self-pay | Admitting: Adult Health

## 2016-08-17 DIAGNOSIS — Z853 Personal history of malignant neoplasm of breast: Secondary | ICD-10-CM

## 2016-08-30 ENCOUNTER — Telehealth: Payer: Self-pay

## 2016-08-30 NOTE — Telephone Encounter (Signed)
Called and left a message with a new appt due to cme day

## 2016-08-31 ENCOUNTER — Telehealth: Payer: Self-pay | Admitting: Emergency Medicine

## 2016-08-31 NOTE — Telephone Encounter (Signed)
Called patient per Jamelle Haring, NP request. Reviewed patients bone density results with her and recommended patient take calcium, vitamin D and do weight bearing exercises.  Mendel Ryder would also like for patient to get dental clearance for bisphosphante drugs. Patient understood this and will discuss results further at her appointment in September with Dr.Gudena.

## 2016-10-05 ENCOUNTER — Ambulatory Visit: Payer: BLUE CROSS/BLUE SHIELD | Attending: Hematology and Oncology | Admitting: Physical Therapy

## 2016-10-05 DIAGNOSIS — M79601 Pain in right arm: Secondary | ICD-10-CM | POA: Insufficient documentation

## 2016-10-05 DIAGNOSIS — R293 Abnormal posture: Secondary | ICD-10-CM | POA: Insufficient documentation

## 2016-10-05 DIAGNOSIS — Z483 Aftercare following surgery for neoplasm: Secondary | ICD-10-CM | POA: Insufficient documentation

## 2016-10-05 DIAGNOSIS — C50511 Malignant neoplasm of lower-outer quadrant of right female breast: Secondary | ICD-10-CM | POA: Insufficient documentation

## 2016-10-05 DIAGNOSIS — Z170 Estrogen receptor positive status [ER+]: Secondary | ICD-10-CM | POA: Insufficient documentation

## 2016-10-05 NOTE — Assessment & Plan Note (Signed)
Right lumpectomy 04/02/2016: IDC grade 2, 0.7 cm, margins negative, one benign lymph node, T1b N0 stage IA, ER 90%, PR 20%, HER-2 negative, Ki-67 12  Radiation: Completed 06/17/16. Treatment plan: Anastrozole 1 mg daily started June 2018  Anastrozole Toxicities:  RTC in 1 year

## 2016-10-06 ENCOUNTER — Ambulatory Visit (HOSPITAL_BASED_OUTPATIENT_CLINIC_OR_DEPARTMENT_OTHER): Payer: BLUE CROSS/BLUE SHIELD | Admitting: Hematology and Oncology

## 2016-10-06 DIAGNOSIS — Z170 Estrogen receptor positive status [ER+]: Secondary | ICD-10-CM | POA: Diagnosis not present

## 2016-10-06 DIAGNOSIS — M810 Age-related osteoporosis without current pathological fracture: Secondary | ICD-10-CM

## 2016-10-06 DIAGNOSIS — Z79811 Long term (current) use of aromatase inhibitors: Secondary | ICD-10-CM | POA: Diagnosis not present

## 2016-10-06 DIAGNOSIS — C50511 Malignant neoplasm of lower-outer quadrant of right female breast: Secondary | ICD-10-CM | POA: Diagnosis not present

## 2016-10-06 MED ORDER — ALENDRONATE SODIUM 70 MG PO TABS: 70.0000 mg | ORAL_TABLET | ORAL | 3 refills | Status: DC

## 2016-10-06 NOTE — Progress Notes (Signed)
Patient Care Team: Kelton Pillar, MD as PCP - General (Family Medicine) Paula Compton, MD as Consulting Physician (Obstetrics and Gynecology) Erroll Luna, MD as Consulting Physician (General Surgery) Nicholas Lose, MD as Consulting Physician (Hematology and Oncology) Kyung Rudd, MD as Consulting Physician (Radiation Oncology) Delice Bison Charlestine Massed, NP as Nurse Practitioner (Hematology and Oncology)  DIAGNOSIS:  Encounter Diagnosis  Name Primary?  . Malignant neoplasm of lower-outer quadrant of right breast of female, estrogen receptor positive ([place])     SUMMARY OF ONCOLOGIC HISTORY:   Malignant neoplasm of lower-outer quadrant of right breast of female, estrogen receptor positive ([place])   03/02/2016 Initial Diagnosis    Right breast biopsy 8:00: Invasive ductal carcinoma grade 1-2, ER 90%, PR 20%, Ki-67 12%, HER-2 negative, screening detected right breast mass LOQ posterior depth 8:00 position 5 mm, axilla negative, T1 1 N0 stage IA clinical stage      04/02/2016 Surgery    Right lumpectomy ([place]): IDC grade 2, 0.7 cm, margins negative, one benign lymph node, T1b N0 stage IA, ER 90%, PR 20%, HER-2 negative, Ki-67 12%      04/16/2016 Oncotype testing    Recurrence score: 15; ROR: 10%      05/21/2016 - 06/17/2016 Radiation Therapy    Adj XRT (moody):  1) Right breast/ 42.5 Gy in 17 fractions 2) Right breast boost/ 7.5 Gy in 3 fractions      06/2016 -  Anti-estrogen oral therapy    Anastrozole '1mg'$  daily       CHIEF COMPLIANT: Follow-up on anastrozole therapy  INTERVAL HISTORY: Teresa Mitchell is a 61 year old with above-mentioned history of right breast cancer treated with lumpectomy and is currently on anastrozole therapy. She is tolerating it extremely well. She was found to have a bone density with a T score of -2.5 with osteoporosis. She is here today to discuss remedies for osteoporosis. She denies any major problem with hot flashes or muscle aches or  pains.  REVIEW OF SYSTEMS:   Constitutional: Denies fevers, chills or abnormal weight loss Eyes: Denies blurriness of vision Ears, nose, mouth, throat, and face: Denies mucositis or sore throat Respiratory: Denies cough, dyspnea or wheezes Cardiovascular: Denies palpitation, chest discomfort Gastrointestinal:  Denies nausea, heartburn or change in bowel habits Skin: Denies abnormal skin rashes Lymphatics: Denies new lymphadenopathy or easy bruising Neurological:Denies numbness, tingling or new weaknesses Behavioral/Psych: Mood is stable, no new changes  Extremities: No lower extremity edema Breast:  denies any pain or lumps or nodules in either breasts All other systems were reviewed with the patient and are negative.  I have reviewed the past medical history, past surgical history, social history and family history with the patient and they are unchanged from previous note.  ALLERGIES:  is allergic to no known allergies.  MEDICATIONS:  Current Outpatient Prescriptions  Medication Sig Dispense Refill  . ALPRAZolam (XANAX) 0.25 MG tablet alprazolam 0.25 mg tablet    . anastrozole (ARIMIDEX) 1 MG tablet Take 1 tablet (1 mg total) by mouth daily. 90 tablet 3  . Biotin 1 MG CAPS Take by mouth.    . diphenhydramine-acetaminophen (TYLENOL PM) 25-500 MG TABS tablet Take 1 tablet by mouth at bedtime as needed.    . Fish Oil OIL by Does not apply route.    Marland Kitchen MAGNESIUM ASPARTATE PO Take 200 mg by mouth 2 (two) times daily.     Marland Kitchen oxyCODONE-acetaminophen (ROXICET) 5-325 MG tablet Take 1-2 tablets by mouth every 4 (four) hours as needed. (Patient not taking:  Reported on 08/13/2016) 15 tablet 0  . pravastatin (PRAVACHOL) 20 MG tablet Take 20 mg by mouth daily.    . Turmeric 500 MG CAPS Take 500 mg by mouth every morning.    . vitamin B-12 (CYANOCOBALAMIN) 100 MCG tablet Take 3,000 mcg by mouth daily.      No current facility-administered medications for this visit.     PHYSICAL  EXAMINATION: ECOG PERFORMANCE STATUS: 1 - Symptomatic but completely ambulatory  Vitals:   10/06/16 1017  BP: 119/70  Pulse: 65  Resp: 18  Temp: 98 F (36.7 C)  SpO2: 100%   Filed Weights   10/06/16 1017  Weight: 139 lb 1.6 oz (63.1 kg)    GENERAL:alert, no distress and comfortable SKIN: skin color, texture, turgor are normal, no rashes or significant lesions EYES: normal, Conjunctiva are pink and non-injected, sclera clear OROPHARYNX:no exudate, no erythema and lips, buccal mucosa, and tongue normal  NECK: supple, thyroid normal size, non-tender, without nodularity LYMPH:  no palpable lymphadenopathy in the cervical, axillary or inguinal LUNGS: clear to auscultation and percussion with normal breathing effort HEART: regular rate & rhythm and no murmurs and no lower extremity edema ABDOMEN:abdomen soft, non-tender and normal bowel sounds MUSCULOSKELETAL:no cyanosis of digits and no clubbing  NEURO: alert & oriented x 3 with fluent speech, no focal motor/sensory deficits EXTREMITIES: No lower extremity edema BREAST: No palpable masses or nodules in either right or left breasts. No palpable axillary supraclavicular or infraclavicular adenopathy no breast tenderness or nipple discharge. (exam performed in the presence of a chaperone)  LABORATORY DATA:  I have reviewed the data as listed   Chemistry      Component Value Date/Time   NA 141 03/11/2016 1249   K 3.9 03/11/2016 1249   CO2 26 03/11/2016 1249   BUN 9.1 03/11/2016 1249   CREATININE 0.7 03/11/2016 1249      Component Value Date/Time   CALCIUM 9.9 03/11/2016 1249   ALKPHOS 60 03/11/2016 1249   AST 21 03/11/2016 1249   ALT 25 03/11/2016 1249   BILITOT 0.95 03/11/2016 1249       Lab Results  Component Value Date   WBC 5.0 03/11/2016   HGB 13.9 03/11/2016   HCT 41.8 03/11/2016   MCV 89.8 03/11/2016   PLT 177 03/11/2016   NEUTROABS 2.8 03/11/2016    ASSESSMENT & PLAN:  Malignant neoplasm of lower-outer  quadrant of right breast of female, estrogen receptor positive ([place]) Right lumpectomy 04/02/2016: IDC grade 2, 0.7 cm, margins negative, one benign lymph node, T1b N0 stage IA, ER 90%, PR 20%, HER-2 negative, Ki-67 12  Radiation: Completed 06/17/16. Treatment plan: Anastrozole 1 mg daily started June 2018  Anastrozole Toxicities: Occasional hot flashes  Osteoporosis T score -2.30 June 2016: Started the patient on Fosamax along with calcium and vitamin D. RTC in 6 months for follow-up   I spent 25 minutes talking to the patient of which more than half was spent in counseling and coordination of care.  No orders of the defined types were placed in this encounter.  The patient has a good understanding of the overall plan. she agrees with it. she will call with any problems that may develop before the next visit here.   Rulon Eisenmenger, MD 10/06/16

## 2016-10-07 ENCOUNTER — Telehealth: Payer: Self-pay | Admitting: Hematology and Oncology

## 2016-10-07 ENCOUNTER — Ambulatory Visit: Payer: BLUE CROSS/BLUE SHIELD | Admitting: Hematology and Oncology

## 2016-10-07 NOTE — Telephone Encounter (Signed)
Called patient regarding march 2019

## 2016-10-12 ENCOUNTER — Encounter: Payer: Self-pay | Admitting: Physical Therapy

## 2016-10-12 ENCOUNTER — Ambulatory Visit: Payer: BLUE CROSS/BLUE SHIELD | Admitting: Physical Therapy

## 2016-10-12 DIAGNOSIS — M79601 Pain in right arm: Secondary | ICD-10-CM

## 2016-10-12 DIAGNOSIS — Z483 Aftercare following surgery for neoplasm: Secondary | ICD-10-CM | POA: Diagnosis not present

## 2016-10-12 DIAGNOSIS — Z170 Estrogen receptor positive status [ER+]: Secondary | ICD-10-CM | POA: Diagnosis not present

## 2016-10-12 DIAGNOSIS — R293 Abnormal posture: Secondary | ICD-10-CM | POA: Diagnosis not present

## 2016-10-12 DIAGNOSIS — C50511 Malignant neoplasm of lower-outer quadrant of right female breast: Secondary | ICD-10-CM

## 2016-10-12 NOTE — Patient Instructions (Addendum)
   Copyright  VHI. All rights reserved.  Closed Chain: Shoulder Abduction / Adduction - on Wall    One hand on wall, step to side and return. Stepping causes shoulder to abduct and adduct. Step _3__ times HOLDING 10 SECONDS, each side, _2RIB CAGE: Lateral Stretch (Standing)    Place palms together over head. Inhaling, reach hands toward right, exhale and bring back to center. Repeat toward left. Repeat _5__ times, holding 5 seconds, alternating sides. Do _3Corner Push    Stand in a corner with a hand on each wall at shoulder level, and feet __12__ inches from walls. Gently lean body toward corner. Hold __10__ seconds. Repeat __3__ times. Do __2__ sessions per day.  http://gt2.exer.us/572   Copyright  VHI. All rights reserved.  __ times per day.    Quadruped quadratus stretch: Get on hands and knees on the floor. Bring hips back toward heels while reaching forward with your hands. Hold this stretch 10 seconds and repeat 3 times, twice a day.

## 2016-10-12 NOTE — Therapy (Signed)
[place], [place], [address] Phone: [phone]   Fax:  [phone]  Physical Therapy Evaluation  Patient Details  Name: Teresa Mitchell MRN: [id number] Date of Birth: 1955-10-20 Referring Provider: Dr. Nicholas Lose  Encounter Date: 10/12/2016      PT End of Session - 10/12/16 1505    Visit Number 1   Number of Visits 8   Date for PT Re-Evaluation 11/09/16   PT Start Time 1050   PT Stop Time 1202   PT Time Calculation (min) 72 min   Activity Tolerance Patient tolerated treatment well   Behavior During Therapy [facility] for tasks assessed/performed      Past Medical History:  Diagnosis Date  . Breast cancer of lower-outer quadrant of right female breast ([place])   . Plantar fasciitis     Past Surgical History:  Procedure Laterality Date  . CESAREAN SECTION    . FEMUR FRACTURE SURGERY Left   . RADIOACTIVE SEED GUIDED MASTECTOMY WITH AXILLARY SENTINEL LYMPH NODE BIOPSY Right 04/02/2016   Procedure: RADIOACTIVE SEED GUIDED PARTIAL MASTECTOMY WITH AXILLARY SENTINEL LYMPH NODE BIOPSY;  Surgeon: Erroll Luna, MD;  Location: [facility];  Service: General;  Laterality: Right;  . TONSILLECTOMY      There were no vitals filed for this visit.       Subjective Assessment - 10/12/16 1050    Subjective Having right armpit pain and it goes into the side of my breast. It's worst at night when I lay on that side. My doctor thought PT might help.   Pertinent History Patient was diagnosed on 02/25/16 with right grade 1-2 invasive ductal carcinoma breast cancer. It is ER/PR positive and HER2 negative with a Ki67 of 12%. It measures 5 mm and is located in the lower outer quadrant.  Lumpectomy 04/02/16 with 1 lymphnode removed (negative) and finished doing XRT 06/17/16.  No chemo planned, but is taking an anti-estrogen pill.   Patient Stated Goals Avoid lymphedema and have right arm feel more comfortable.    Currently in Pain? Yes   Pain Score 4    Pain Location Axilla   Pain Orientation Right   Pain Descriptors / Indicators Nagging   Pain Type Chronic pain   Pain Onset More than a month ago   Pain Frequency Intermittent   Aggravating Factors  Sleeping on my right side   Pain Relieving Factors Nothing   Multiple Pain Sites No            [facility] PT Assessment - 10/12/16 0001      Assessment   Medical Diagnosis ROM limitations   Referring Provider Dr. Nicholas Lose   Onset Date/Surgical Date 04/02/16   Hand Dominance Right   Next MD Visit --  Dr. Brantley Stage sometime in October   Prior Therapy 1 visit     Precautions   Precautions Other (comment)   Precaution Comments Right arm lymphedema risk     Restrictions   Weight Bearing Restrictions No     Balance Screen   Has the patient fallen in the past 6 months No   Has the patient had a decrease in activity level because of a fear of falling?  No   Is the patient reluctant to leave their home because of a fear of falling?  No     Home Environment   Living Environment Private residence   Living Arrangements Spouse/significant other   Available Help at Discharge Family  Prior Function   Level of Independence Independent   Vocation Retired   Leisure Yoga and Pilates and treadmill; started back before she started radiation     Cognition   Overall Cognitive Status Within Functional Limits for tasks assessed     Observation/Other Assessments   Quick DASH  20.45     Posture/Postural Control   Posture/Postural Control Postural limitations   Postural Limitations Rounded Shoulders;Forward head     ROM / Strength   AROM / PROM / Strength AROM;Strength     AROM   Overall AROM Comments Neural tension present in right UE; no cording ntoed.   AROM Assessment Site Shoulder;Cervical   Right/Left Shoulder Right;Left   Right Shoulder Extension 62 Degrees   Right Shoulder Flexion 152 Degrees   Right Shoulder ABduction 158 Degrees   With c/o tightness and discomfort   Right Shoulder Internal Rotation 72 Degrees   Right Shoulder External Rotation 80 Degrees   Left Shoulder Extension 62 Degrees   Left Shoulder Flexion 154 Degrees   Left Shoulder ABduction 160 Degrees   Left Shoulder Internal Rotation 76 Degrees   Left Shoulder External Rotation 85 Degrees   Cervical Flexion WNL   Cervical Extension WNL   Cervical - Right Side Bend WNL   Cervical - Left Side Bend WNL   Cervical - Right Rotation WNL   Cervical - Left Rotation WNL     Palpation   Palpation comment Palpable scar adhesions present on right lateral breast at incision site. Decreased scar mobility which may be contributing to pain.           LYMPHEDEMA/ONCOLOGY QUESTIONNAIRE - 10/12/16 1058      Type   Cancer Type Right breast cancer     Surgeries   Lumpectomy Date 04/02/16   Sentinel Lymph Node Biopsy Date 04/02/16   Number Lymph Nodes Removed 1     Treatment   Active Chemotherapy Treatment No   Past Chemotherapy Treatment No   Active Radiation Treatment No   Past Radiation Treatment Yes   Date 06/17/16   Body Site right breast   Current Hormone Treatment Yes   Date 07/15/16   Drug Name Anastrozole   Past Hormone Therapy No     What other symptoms do you have   Are you Having Heaviness or Tightness Yes   Are you having Pain Yes   Are you having pitting edema No   Is it Hard or Difficult finding clothes that fit No   Do you have infections No   Is there Decreased scar mobility Yes   Stemmer Sign No     Lymphedema Assessments   Lymphedema Assessments Upper extremities     Right Upper Extremity Lymphedema   10 cm Proximal to Olecranon Process 29.9 cm   Olecranon Process 25 cm   10 cm Proximal to Ulnar Styloid Process 24.1 cm   Just Proximal to Ulnar Styloid Process 15.8 cm   Across Hand at [employer] 17.5 cm   At [place] of 2nd Digit 5.9 cm   Other Pt reports 5# weight gain since last visit     Left Upper Extremity  Lymphedema   10 cm Proximal to Olecranon Process 29.3 cm   Olecranon Process 24.4 cm   10 cm Proximal to Ulnar Styloid Process 22.8 cm   Just Proximal to Ulnar Styloid Process 15.4 cm   Across Hand at [employer] 17.4 cm   At [place] of 2nd Digit 5.8 cm  Neldon Mc - 10/12/16 0001    Open a tight or new jar No difficulty   Do heavy household chores (wash walls, wash floors) No difficulty   Carry a shopping bag or briefcase No difficulty   Wash your back No difficulty   Use a knife to cut food No difficulty   Recreational activities in which you take some force or impact through your arm, shoulder, or hand (golf, hammering, tennis) Moderate difficulty   During the past week, to what extent has your arm, shoulder or hand problem interfered with your normal social activities with family, friends, neighbors, or groups? Slightly   During the past week, to what extent has your arm, shoulder or hand problem limited your work or other regular daily activities Modererately   Arm, shoulder, or hand pain. Moderate   Tingling (pins and needles) in your arm, shoulder, or hand None   Difficulty Sleeping Moderate difficulty   DASH Score 20.45 %      Objective measurements completed on examination: See above findings.          [facility] Adult PT Treatment/Exercise - 10/12/16 0001      Manual Therapy   Manual Therapy Soft tissue mobilization;Passive ROM   Soft tissue mobilization Scar mobilization to right lateral breast at incision site and along lateral ribs with patient in left sidelying with right UE in ~ 90 degrees flexion supported by PT   Passive ROM PROM right shoulder flexion and abduction in left sidelying to pt tolerance.                PT Education - 10/12/16 1504    Education provided Yes   Education Details Right lateral trunk stretching and pec stretching   Person(s) Educated Patient   Methods Explanation;Demonstration;Handout   Comprehension Returned  demonstration;Verbalized understanding                Long Term Clinic Goals - 10/12/16 1518      CC Long Term Goal  #1   Title Patient will demonstrate proper technique of home exercise program for shoulder and trunk ROM.   Time 4   Period Weeks   Status New     CC Long Term Goal  #2   Title Patient reports >/= 50% less difficulty sleeping at night.   Time 4   Period Weeks   Status Achieved     CC Long Term Goal  #3   Title Pt. will be knowledgeable about performing right UE neural tension stretch, and about gradually increasing her exercise program   Time 4   Period Weeks   Status Achieved     CC Long Term Goal  #4   Title Patient will report overall pain decreased >/= 50% to tolerate sleeping and daily tasks with greater ease.   Time 4   Period Weeks   Status New             Plan - 10/12/16 1507    Clinical Impression Statement Patient is a pleasant 61 y.o. woman s/p right lumpectomy and sentinel node biopsy (1 negative node) for ER/PR positive invasive ductal breast cancer followed by radiation which was completed 06/17/16. no chemo was needed and she is currently on Anastrazole. She is doing quite well except she has residual pain in her right lateral breast around her incision site which bothers her daily and interefers with sleep. She will benefit from PT to restore shoulder ROM and decrease scar tissue tightness.   History  and Personal Factors relevant to plan of care: Hx right breast cancer   Clinical Presentation Stable   Clinical Decision Making Low   Rehab Potential Excellent   Clinical Impairments Affecting Rehab Potential None   PT Frequency 2x / week   PT Duration 4 weeks   PT Treatment/Interventions ADLs/Self Care Home Management;Patient/family education;Therapeutic exercise;Manual techniques;Scar mobilization;Passive range of motion   PT Next Visit Plan Myofascial release, scar mobilization, manual lymph drainage; review HEP   PT Home Exercise  Plan Shoulder ROM, pec stretch, lateral trunk stretching   Consulted and Agree with Plan of Care Patient      Patient will benefit from skilled therapeutic intervention in order to improve the following deficits and impairments:  Postural dysfunction, Decreased range of motion, Impaired UE functional use, Pain, Decreased scar mobility, Decreased knowledge of precautions, Decreased strength, Increased fascial restricitons  Visit Diagnosis: Aftercare following surgery for neoplasm - Plan: PT plan of care cert/re-cert  Pain in right arm - Plan: PT plan of care cert/re-cert  Carcinoma of lower-outer quadrant of right breast in female, estrogen receptor positive ([place]) - Plan: PT plan of care cert/re-cert  Abnormal posture - Plan: PT plan of care cert/re-cert     Problem List Patient Active Problem List   Diagnosis Date Noted  . Malignant neoplasm of lower-outer quadrant of right breast of female, estrogen receptor positive ([place]) 03/10/2016  . Food allergy 01/30/2015  . Abdominal pain 01/30/2015   Annia Friendly, PT 10/12/16 3:28 PM  [place], [place], [address] Phone: [phone]   Fax:  [phone]  Name: Teresa Mitchell MRN: [id number] Date of Birth: [birth year]

## 2016-10-15 ENCOUNTER — Ambulatory Visit: Payer: BLUE CROSS/BLUE SHIELD | Admitting: Physical Therapy

## 2016-10-15 ENCOUNTER — Encounter: Payer: Self-pay | Admitting: Physical Therapy

## 2016-10-15 DIAGNOSIS — Z483 Aftercare following surgery for neoplasm: Secondary | ICD-10-CM | POA: Diagnosis not present

## 2016-10-15 DIAGNOSIS — Z170 Estrogen receptor positive status [ER+]: Secondary | ICD-10-CM | POA: Diagnosis not present

## 2016-10-15 DIAGNOSIS — R293 Abnormal posture: Secondary | ICD-10-CM | POA: Diagnosis not present

## 2016-10-15 DIAGNOSIS — C50511 Malignant neoplasm of lower-outer quadrant of right female breast: Secondary | ICD-10-CM | POA: Diagnosis not present

## 2016-10-15 DIAGNOSIS — M79601 Pain in right arm: Secondary | ICD-10-CM | POA: Diagnosis not present

## 2016-10-15 NOTE — Therapy (Signed)
[facility] [address] [place], [place], [address] Phone: [phone]   Fax:  [phone]  Physical Therapy Treatment  Patient Details  Name: Teresa Mitchell MRN: [id number] Date of Birth: 1955/08/06 Referring Provider: Dr. Serena Croissant  Encounter Date: 10/15/2016      PT End of Session - 10/15/16 1108    Date for PT Re-Evaluation 11/09/16   PT Start Time 1016   PT Stop Time 1103   PT Time Calculation (min) 47 min   Activity Tolerance Patient tolerated treatment well   Behavior During Therapy [facility] for tasks assessed/performed      Past Medical History:  Diagnosis Date  . Breast cancer of lower-outer quadrant of right female breast ([facility])   . Plantar fasciitis     Past Surgical History:  Procedure Laterality Date  . CESAREAN SECTION    . FEMUR FRACTURE SURGERY Left   . RADIOACTIVE SEED GUIDED MASTECTOMY WITH AXILLARY SENTINEL LYMPH NODE BIOPSY Right 04/02/2016   Procedure: RADIOACTIVE SEED GUIDED PARTIAL MASTECTOMY WITH AXILLARY SENTINEL LYMPH NODE BIOPSY;  Surgeon: Harriette Bouillon, MD;  Location: [facility];  Service: General;  Laterality: Right;  . TONSILLECTOMY      There were no vitals filed for this visit.      Subjective Assessment - 10/15/16 1058    Subjective I think last time helped. I feel looser and my breast doesn't feel as hard.   Pertinent History Patient was diagnosed on 02/25/16 with right grade 1-2 invasive ductal carcinoma breast cancer. It is ER/PR positive and HER2 negative with a Ki67 of 12%. It measures 5 mm and is located in the lower outer quadrant.  Lumpectomy 04/02/16 with 1 lymphnode removed (negative) and finished doing XRT 06/17/16.  No chemo planned, but is taking an anti-estrogen pill.   Patient Stated Goals Avoid lymphedema and have right arm feel more comfortable.   Currently in Pain? Yes   Pain Score 3    Pain Location Axilla   Pain Orientation Right   Pain Descriptors  / Indicators Nagging   Pain Type Chronic pain                         [facility] Adult PT Treatment/Exercise - 10/15/16 0001      Manual Therapy   Manual Therapy Soft tissue mobilization;Myofascial release;Passive ROM   Manual therapy comments Less palpable fibrotic tissue noted in lateral right breast.   Soft tissue mobilization Scar mobilization to right lateral breast at incision site and along lateral ribs with patient in left sidelying with right UE in ~ 90 degrees flexion supported by PT and was able to progress to ~ 120 degrees abduction while in this position; Mobilized pectoralis at insertion site with C-shaped tissue mobilization   Myofascial Release To right lateral trunk in supine and left sidelying   Passive ROM PROM right shoulder flexion and abduction in left sidelying to pt tolerance.                PT Education - 10/15/16 1106    Education provided Yes   Education Details Latissimus stretch with ball   Person(s) Educated Patient   Methods Explanation;Demonstration;Handout   Comprehension Returned demonstration;Verbalized understanding                Long Term Clinic Goals - 10/12/16 1518      CC Long Term Goal  #1   Title Patient will demonstrate proper technique of home  exercise program for shoulder and trunk ROM.   Time 4   Period Weeks   Status New     CC Long Term Goal  #2   Title Patient reports >/= 50% less difficulty sleeping at night.   Time 4   Period Weeks   Status Achieved     CC Long Term Goal  #3   Title Pt. will be knowledgeable about performing right UE neural tension stretch, and about gradually increasing her exercise program   Time 4   Period Weeks   Status Achieved     CC Long Term Goal  #4   Title Patient will report overall pain decreased >/= 50% to tolerate sleeping and daily tasks with greater ease.   Time 4   Period Weeks   Status New            Plan - 10/15/16 1109    Clinical Impression  Statement Significantly improved already. She reports soreness still present at lateral breast but tissue is less tight and she tolerated increased ROM without pain today. Significant reduction in fibrosis at lateral breast.   Rehab Potential Excellent   Clinical Impairments Affecting Rehab Potential None   PT Frequency 2x / week   PT Duration 4 weeks   PT Treatment/Interventions ADLs/Self Care Home Management;Patient/family education;Therapeutic exercise;Manual techniques;Scar mobilization;Passive range of motion   PT Next Visit Plan Continue manual techniques as pt is responding very well to that. End session with stretches to promote lateral trunk elongation on right side.   PT Home Exercise Plan Shoulder ROM, pec stretch, lateral trunk stretching   Consulted and Agree with Plan of Care Patient      Patient will benefit from skilled therapeutic intervention in order to improve the following deficits and impairments:  Postural dysfunction, Decreased range of motion, Impaired UE functional use, Pain, Decreased scar mobility, Decreased knowledge of precautions, Decreased strength, Increased fascial restricitons  Visit Diagnosis: Aftercare following surgery for neoplasm  Pain in right arm  Carcinoma of lower-outer quadrant of right breast in female, estrogen receptor positive ([place])  Abnormal posture     Problem List Patient Active Problem List   Diagnosis Date Noted  . Malignant neoplasm of lower-outer quadrant of right breast of female, estrogen receptor positive ([place]) 03/10/2016  . Food allergy 01/30/2015  . Abdominal pain 01/30/2015    Annia Friendly, PT 10/15/16 11:11 AM  [place], [place], [address] Phone: [phone]   Fax:  [phone]  Name: Teresa Mitchell MRN: [id number] Date of Birth: 12-Aug-1955

## 2016-10-15 NOTE — Patient Instructions (Signed)
[employer] [place]    Stand with hands on ball pressing into wall. Roll ball in semi-circle to one side. Hold _3-5__ seconds. Go to the left.  Do _1__ sets of _10__ repetitions.  Copyright  VHI. All rights reserved.   You can also do this on hands and knees.

## 2016-10-19 ENCOUNTER — Encounter: Payer: Self-pay | Admitting: Physical Therapy

## 2016-10-19 ENCOUNTER — Ambulatory Visit: Payer: BLUE CROSS/BLUE SHIELD | Admitting: Physical Therapy

## 2016-10-19 DIAGNOSIS — Z483 Aftercare following surgery for neoplasm: Secondary | ICD-10-CM | POA: Diagnosis not present

## 2016-10-19 DIAGNOSIS — R293 Abnormal posture: Secondary | ICD-10-CM | POA: Diagnosis not present

## 2016-10-19 DIAGNOSIS — C50511 Malignant neoplasm of lower-outer quadrant of right female breast: Secondary | ICD-10-CM | POA: Diagnosis not present

## 2016-10-19 DIAGNOSIS — M79601 Pain in right arm: Secondary | ICD-10-CM | POA: Diagnosis not present

## 2016-10-19 DIAGNOSIS — Z170 Estrogen receptor positive status [ER+]: Secondary | ICD-10-CM

## 2016-10-19 NOTE — Therapy (Signed)
[place], [place], [address] Phone: [phone]   Fax:  [phone]  Physical Therapy Evaluation  Patient Details  Name: Teresa Mitchell MRN: [id number] Date of Birth: November 08, 1955 Referring Provider: Dr. Nicholas Lose  Encounter Date: 10/19/2016      PT End of Session - 10/19/16 1152    PT Start Time 1100   PT Stop Time 1149   PT Time Calculation (min) 49 min   Activity Tolerance Patient tolerated treatment well   Behavior During Therapy [facility] for tasks assessed/performed      Past Medical History:  Diagnosis Date  . Breast cancer of lower-outer quadrant of right female breast ([place])   . Plantar fasciitis     Past Surgical History:  Procedure Laterality Date  . CESAREAN SECTION    . FEMUR FRACTURE SURGERY Left   . RADIOACTIVE SEED GUIDED MASTECTOMY WITH AXILLARY SENTINEL LYMPH NODE BIOPSY Right 04/02/2016   Procedure: RADIOACTIVE SEED GUIDED PARTIAL MASTECTOMY WITH AXILLARY SENTINEL LYMPH NODE BIOPSY;  Surgeon: Erroll Luna, MD;  Location: [employer] [facility];  Service: General;  Laterality: Right;  . TONSILLECTOMY      There were no vitals filed for this visit.       Subjective Assessment - 10/19/16 1101    Subjective Did some weeding this weekend and felt some "rubber bands" in my upper arm. I'm sleeping better and my pain isn't waking me up so that's alot better. My breast feels slot softer and the hard knot is gone.   Pertinent History Patient was diagnosed on 02/25/16 with right grade 1-2 invasive ductal carcinoma breast cancer. It is ER/PR positive and HER2 negative with a Ki67 of 12%. It measures 5 mm and is located in the lower outer quadrant.  Lumpectomy 04/02/16 with 1 lymphnode removed (negative) and finished doing XRT 06/17/16.  No chemo planned, but is taking an anti-estrogen pill.   Patient Stated Goals Avoid lymphedema and have right arm feel more comfortable.   Currently in  Pain? No/denies                Objective measurements completed on examination: See above findings.          [facility] Adult PT Treatment/Exercise - 10/19/16 0001      Manual Therapy   Manual Therapy Soft tissue mobilization;Myofascial release;Passive ROM   Soft tissue mobilization Scar mobilization to right lateral breast at incision site and along lateral ribs with patient in left sidelying with right UE in ~ 90 degrees flexion supported by PT and was able to progress to ~ 120 degrees abduction while in this position; Mobilized pectoralis at insertion site with C-shaped tissue mobilization   Myofascial Release To right lateral trunk in supine and left sidelying   Passive ROM PROM right shoulder flexion and abduction in left sidelying to pt tolerance.                        [facility] Goals - 10/12/16 1518      CC Long Term Goal  #1   Title Patient will demonstrate proper technique of home exercise program for shoulder and trunk ROM.   Time 4   Period Weeks   Status New     CC Long Term Goal  #2   Title Patient reports >/= 50% less difficulty sleeping at night.   Time 4   Period Weeks   Status Achieved     CC  Long Term Goal  #3   Title Pt. will be knowledgeable about performing right UE neural tension stretch, and about gradually increasing her exercise program   Time 4   Period Weeks   Status Achieved     CC Long Term Goal  #4   Title Patient will report overall pain decreased >/= 50% to tolerate sleeping and daily tasks with greater ease.   Time 4   Period Weeks   Status New             Plan - 10/19/16 1152    Clinical Impression Statement Patient appears to making good progress. Some of the manual therapy today performed by Collie Siad, PTA. No c/o tenderness or pain during treatment. She appears to be on track to meet goals but will benefit from continued PT to progress ROM and decrease cording and tightness.   Rehab  Potential Excellent   Clinical Impairments Affecting Rehab Potential None   PT Frequency 2x / week   PT Duration 4 weeks   PT Treatment/Interventions ADLs/Self Care Home Management;Patient/family education;Therapeutic exercise;Manual techniques;Scar mobilization;Passive range of motion   PT Next Visit Plan Continue manual techniques as pt is responding very well to that. Measure ROM and assess goals.   PT Home Exercise Plan Shoulder ROM, pec stretch, lateral trunk stretching   Consulted and Agree with Plan of Care Patient      Patient will benefit from skilled therapeutic intervention in order to improve the following deficits and impairments:  Postural dysfunction, Decreased range of motion, Impaired UE functional use, Pain, Decreased scar mobility, Decreased knowledge of precautions, Decreased strength, Increased fascial restricitons  Visit Diagnosis: Aftercare following surgery for neoplasm  Pain in right arm  Carcinoma of lower-outer quadrant of right breast in female, estrogen receptor positive ([place])  Abnormal posture     Problem List Patient Active Problem List   Diagnosis Date Noted  . Malignant neoplasm of lower-outer quadrant of right breast of female, estrogen receptor positive ([place]) 03/10/2016  . Food allergy 01/30/2015  . Abdominal pain 01/30/2015    Annia Friendly, PT 10/19/16 11:55 AM  [place], [place], [address] Phone: [phone]   Fax:  [phone]  Name: Teresa Mitchell MRN: [id number] Date of Birth: 1955/09/18

## 2016-10-22 ENCOUNTER — Ambulatory Visit: Payer: BLUE CROSS/BLUE SHIELD | Admitting: Physical Therapy

## 2016-10-22 DIAGNOSIS — Z483 Aftercare following surgery for neoplasm: Secondary | ICD-10-CM | POA: Diagnosis not present

## 2016-10-22 DIAGNOSIS — R293 Abnormal posture: Secondary | ICD-10-CM | POA: Diagnosis not present

## 2016-10-22 DIAGNOSIS — Z170 Estrogen receptor positive status [ER+]: Secondary | ICD-10-CM | POA: Diagnosis not present

## 2016-10-22 DIAGNOSIS — C50511 Malignant neoplasm of lower-outer quadrant of right female breast: Secondary | ICD-10-CM | POA: Diagnosis not present

## 2016-10-22 DIAGNOSIS — M79601 Pain in right arm: Secondary | ICD-10-CM | POA: Diagnosis not present

## 2016-10-22 NOTE — Therapy (Signed)
[place], [place], [address] Phone: [phone]   Fax:  [phone]  Physical Therapy Treatment  Patient Details  Name: Teresa Mitchell MRN: [id number] Date of Birth: [birth year] Referring Provider: Dr. Nicholas Lose  Encounter Date: 10/22/2016      PT End of Session - 10/22/16 1511    Visit Number 4   Number of Visits 8   Date for PT Re-Evaluation 11/09/16   PT Start Time 1351   PT Stop Time 1434   PT Time Calculation (min) 43 min   Activity Tolerance Patient tolerated treatment well   Behavior During Therapy [facility] for tasks assessed/performed      Past Medical History:  Diagnosis Date  . Breast cancer of lower-outer quadrant of right female breast ([place])   . Plantar fasciitis     Past Surgical History:  Procedure Laterality Date  . CESAREAN SECTION    . FEMUR FRACTURE SURGERY Left   . RADIOACTIVE SEED GUIDED MASTECTOMY WITH AXILLARY SENTINEL LYMPH NODE BIOPSY Right 04/02/2016   Procedure: RADIOACTIVE SEED GUIDED PARTIAL MASTECTOMY WITH AXILLARY SENTINEL LYMPH NODE BIOPSY;  Surgeon: Erroll Luna, MD;  Location: [facility];  Service: General;  Laterality: Right;  . TONSILLECTOMY      There were no vitals filed for this visit.      Subjective Assessment - 10/22/16 1353    Subjective Just a little sore from doing the stretches, but things are getting better.  I've got more motion and I'm not so scared about it.   Currently in Pain? No/denies            [facility] PT Assessment - 10/22/16 0001      AROM   Right Shoulder Flexion 156 Degrees   Right Shoulder ABduction 160 Degrees                     [facility] Adult PT Treatment/Exercise - 10/22/16 0001      Shoulder Exercises: Stretch   Other Shoulder Stretches hooklying lower trunk rotation with right arm outstretched to side x 30 seconds   Other Shoulder Stretches backward shoulder rolls at end of session     Manual Therapy   Manual Therapy Scapular mobilization   Soft tissue mobilization right lower outer breast area around scars   Myofascial Release crosshands across right axilla with one hand on right upper arm and other at right flank; likewise with second hand at right abdomen, so in a diagonal. Unilateral pull at area of right pect insertion. Right UE myofascial pulling in supine with movement into abduction to tolerance. In left sidelying, release with one hand on proximal upper arm and other hand in opposite direction at right flank, then at right abdomen.   Scapular Mobilization to right scapula in left sidelying   Passive ROM right shoulder into er, abduction and flexion in supine                        [place] - 10/22/16 1355      CC Long Term Goal  #1   Title Patient will demonstrate proper technique of home exercise program for shoulder and trunk ROM.   Status On-going     CC Long Term Goal  #2   Title Patient reports >/= 50% less difficulty sleeping at night.   Status Achieved     CC Long Term Goal  #3   Title Pt. will  be knowledgeable about performing right UE neural tension stretch, and about gradually increasing her exercise program   Status Achieved     CC Long Term Goal  #4   Title Patient will report overall pain decreased >/= 50% to tolerate sleeping and daily tasks with greater ease.   Baseline 80% better as of 10/22/16   Status Achieved            Plan - 10/22/16 1512    Clinical Impression Statement Patient has met some goals and feels that therapy is helping.  At end of session, she reported feeling stretched compared to when she came in. She has had some soreness from therapy.   Rehab Potential Excellent   Clinical Impairments Affecting Rehab Potential None   PT Frequency 2x / week   PT Duration 4 weeks   PT Treatment/Interventions ADLs/Self Care Home Management;Patient/family education;Therapeutic exercise;Manual  techniques;Scar mobilization;Passive range of motion   PT Next Visit Plan Continue manual techniques as pt is responding very well to that.   PT Home Exercise Plan Shoulder ROM, pec stretch, lateral trunk stretching; lower trunk rotation with arms outstretched for pect stretch   Consulted and Agree with Plan of Care Patient      Patient will benefit from skilled therapeutic intervention in order to improve the following deficits and impairments:  Postural dysfunction, Decreased range of motion, Impaired UE functional use, Pain, Decreased scar mobility, Decreased knowledge of precautions, Decreased strength, Increased fascial restricitons  Visit Diagnosis: Aftercare following surgery for neoplasm  Pain in right arm     Problem List Patient Active Problem List   Diagnosis Date Noted  . Malignant neoplasm of lower-outer quadrant of right breast of female, estrogen receptor positive ([place]) 03/10/2016  . Food allergy 01/30/2015  . Abdominal pain 01/30/2015    SALISBURY,DONNA 10/22/2016, 3:15 PM  [place], [place], [address] Phone: [phone]   Fax:  [phone]  Name: Teresa Mitchell MRN: [id number] Date of Birth: 13-Aug-1955  Serafina Royals, PT 10/22/16 3:15 PM

## 2016-10-26 ENCOUNTER — Ambulatory Visit: Payer: BLUE CROSS/BLUE SHIELD | Attending: Hematology and Oncology | Admitting: Physical Therapy

## 2016-10-26 DIAGNOSIS — C50511 Malignant neoplasm of lower-outer quadrant of right female breast: Secondary | ICD-10-CM | POA: Diagnosis not present

## 2016-10-26 DIAGNOSIS — Z170 Estrogen receptor positive status [ER+]: Secondary | ICD-10-CM | POA: Insufficient documentation

## 2016-10-26 DIAGNOSIS — Z483 Aftercare following surgery for neoplasm: Secondary | ICD-10-CM | POA: Insufficient documentation

## 2016-10-26 DIAGNOSIS — M79601 Pain in right arm: Secondary | ICD-10-CM | POA: Insufficient documentation

## 2016-10-26 DIAGNOSIS — R293 Abnormal posture: Secondary | ICD-10-CM | POA: Diagnosis not present

## 2016-10-26 DIAGNOSIS — Z853 Personal history of malignant neoplasm of breast: Secondary | ICD-10-CM | POA: Diagnosis not present

## 2016-10-26 NOTE — Therapy (Signed)
[place], [place], [address] Phone: [phone]   Fax:  [phone]  Physical Therapy Treatment  Patient Details  Name: Teresa Mitchell MRN: [id number] Date of Birth: Sep 23, 1955 Referring Provider: Dr. Nicholas Lose  Encounter Date: 10/26/2016      PT End of Session - 10/26/16 1040    Visit Number 5   Number of Visits 8   Date for PT Re-Evaluation 11/09/16   PT Start Time 0845   PT Stop Time 0932   PT Time Calculation (min) 47 min   Activity Tolerance Patient tolerated treatment well   Behavior During Therapy [facility] for tasks assessed/performed      Past Medical History:  Diagnosis Date  . Breast cancer of lower-outer quadrant of right female breast ([place])   . Plantar fasciitis     Past Surgical History:  Procedure Laterality Date  . CESAREAN SECTION    . FEMUR FRACTURE SURGERY Left   . RADIOACTIVE SEED GUIDED MASTECTOMY WITH AXILLARY SENTINEL LYMPH NODE BIOPSY Right 04/02/2016   Procedure: RADIOACTIVE SEED GUIDED PARTIAL MASTECTOMY WITH AXILLARY SENTINEL LYMPH NODE BIOPSY;  Surgeon: Erroll Luna, MD;  Location: [facility];  Service: General;  Laterality: Right;  . TONSILLECTOMY      There were no vitals filed for this visit.      Subjective Assessment - 10/26/16 0849    Subjective I see Dr. Brantley Stage right after this. I'm always a little sore after therapy, but nothing too bad last visit. "I can really feel a difference when I reach up into the cupboard--it stretches, but I don't feel all that pain."   Pertinent History Patient was diagnosed on 02/25/16 with right grade 1-2 invasive ductal carcinoma breast cancer. It is ER/PR positive and HER2 negative with a Ki67 of 12%. It measures 5 mm and is located in the lower outer quadrant.  Lumpectomy 04/02/16 with 1 lymphnode removed (negative) and finished doing XRT 06/17/16.  No chemo planned, but is taking an anti-estrogen pill.   Currently in Pain? No/denies                         [facility] Adult PT Treatment/Exercise - 10/26/16 0001      Shoulder Exercises: Stretch   Other Shoulder Stretches supine over towel roll: horizontal abduction stretch x 30 seconds, then active repetitions x 10; then "Y" movement actively x 10   Other Shoulder Stretches supine over towel roll, relax shoulders back first, then shoulder isometric extension/scapular retraction 5 counts x 5  backward shoulder rolls at end of session     Manual Therapy   Myofascial Release crosshands across right axilla with one hand on right upper arm and other at right flank; likewise with second hand at right abdomen, so in a diagonal. Unilateral pull at area of right pect insertion. Right UE myofascial pulling in supine with movement into abduction to tolerance. In left sidelying, release with one hand on proximal upper arm and other hand in opposite direction at right flank, then at right abdomen.   Scapular Mobilization to right scapula in left sidelying   Passive ROM right shoulder into er, abduction and flexion in supine                        [facility] - 10/22/16 1355      CC Long Term Goal  #1   Title Patient will demonstrate  proper technique of home exercise program for shoulder and trunk ROM.   Status On-going     CC Long Term Goal  #2   Title Patient reports >/= 50% less difficulty sleeping at night.   Status Achieved     CC Long Term Goal  #3   Title Pt. will be knowledgeable about performing right UE neural tension stretch, and about gradually increasing her exercise program   Status Achieved     CC Long Term Goal  #4   Title Patient will report overall pain decreased >/= 50% to tolerate sleeping and daily tasks with greater ease.   Baseline 80% better as of 10/22/16   Status Achieved            Plan - 10/26/16 1040    Clinical Impression Statement She sees that she is improving, as when  she reaches up into a cupboard, it doesn't hurt now.  Gets mild soreness from therapy, but feels the stretching is helpful.   Rehab Potential Excellent   Clinical Impairments Affecting Rehab Potential None   PT Frequency 2x / week   PT Duration 4 weeks   PT Treatment/Interventions ADLs/Self Care Home Management;Patient/family education;Therapeutic exercise;Manual techniques;Scar mobilization;Passive range of motion   PT Next Visit Plan Continue manual techniques as pt is responding very well to that. Continue supine over towel roll or foam roller.   PT Home Exercise Plan Shoulder ROM, pec stretch, lateral trunk stretching; lower trunk rotation with arms outstretched for pect stretch   Consulted and Agree with Plan of Care Patient      Patient will benefit from skilled therapeutic intervention in order to improve the following deficits and impairments:  Postural dysfunction, Decreased range of motion, Impaired UE functional use, Pain, Decreased scar mobility, Decreased knowledge of precautions, Decreased strength, Increased fascial restricitons  Visit Diagnosis: Aftercare following surgery for neoplasm  Pain in right arm     Problem List Patient Active Problem List   Diagnosis Date Noted  . Malignant neoplasm of lower-outer quadrant of right breast of female, estrogen receptor positive ([place]) 03/10/2016  . Food allergy 01/30/2015  . Abdominal pain 01/30/2015    Teresa Mitchell 10/26/2016, 10:42 AM  [place], [place], [address] Phone: [phone]   Fax:  [phone]  Name: Teresa Mitchell MRN: [id number] Date of Birth: 25-Nov-1955  Serafina Royals, PT 10/26/16 10:43 AM

## 2016-10-29 ENCOUNTER — Encounter: Payer: Self-pay | Admitting: Physical Therapy

## 2016-10-29 ENCOUNTER — Ambulatory Visit: Payer: BLUE CROSS/BLUE SHIELD | Admitting: Physical Therapy

## 2016-10-29 DIAGNOSIS — R293 Abnormal posture: Secondary | ICD-10-CM | POA: Diagnosis not present

## 2016-10-29 DIAGNOSIS — Z170 Estrogen receptor positive status [ER+]: Secondary | ICD-10-CM

## 2016-10-29 DIAGNOSIS — Z483 Aftercare following surgery for neoplasm: Secondary | ICD-10-CM | POA: Diagnosis not present

## 2016-10-29 DIAGNOSIS — M79601 Pain in right arm: Secondary | ICD-10-CM

## 2016-10-29 DIAGNOSIS — C50511 Malignant neoplasm of lower-outer quadrant of right female breast: Secondary | ICD-10-CM | POA: Diagnosis not present

## 2016-10-29 NOTE — Therapy (Signed)
[place], [place], [address] Phone: [phone]   Fax:  [phone]  Physical Therapy Treatment  Patient Details  Name: Teresa Mitchell MRN: [id number] Date of Birth: Jul 05, 1955 Referring Provider: Dr. Nicholas Lose  Encounter Date: 10/29/2016      PT End of Session - 10/29/16 1110    Visit Number 6   Number of Visits 8   Date for PT Re-Evaluation 11/09/16   PT Start Time 1103   PT Stop Time 1146   PT Time Calculation (min) 43 min   Activity Tolerance Patient tolerated treatment well   Behavior During Therapy [facility] for tasks assessed/performed      Past Medical History:  Diagnosis Date  . Breast cancer of lower-outer quadrant of right female breast ([place])   . Plantar fasciitis     Past Surgical History:  Procedure Laterality Date  . CESAREAN SECTION    . FEMUR FRACTURE SURGERY Left   . RADIOACTIVE SEED GUIDED MASTECTOMY WITH AXILLARY SENTINEL LYMPH NODE BIOPSY Right 04/02/2016   Procedure: RADIOACTIVE SEED GUIDED PARTIAL MASTECTOMY WITH AXILLARY SENTINEL LYMPH NODE BIOPSY;  Surgeon: Erroll Luna, MD;  Location: [facility];  Service: General;  Laterality: Right;  . TONSILLECTOMY      There were no vitals filed for this visit.      Subjective Assessment - 10/29/16 1108    Subjective My ribs are really sore from her working on my side and pulling the skin "apart" but I'm better. The pain isn't waking my up and I can reach so much easier without the tearing sensation. I feel like I can probably start doing this on my own at home now and I can get back to yoga.,   Pertinent History Patient was diagnosed on 02/25/16 with right grade 1-2 invasive ductal carcinoma breast cancer. It is ER/PR positive and HER2 negative with a Ki67 of 12%. It measures 5 mm and is located in the lower outer quadrant.  Lumpectomy 04/02/16 with 1 lymphnode removed (negative) and finished doing XRT 06/17/16.  No  chemo planned, but is taking an anti-estrogen pill.   Patient Stated Goals Avoid lymphedema and have right arm feel more comfortable.   Currently in Pain? No/denies            [facility] PT Assessment - 10/29/16 0001      AROM   Right Shoulder Flexion 162 Degrees   Right Shoulder ABduction 167 Degrees              [place] - 10/29/16 0001    Open a tight or new jar No difficulty   Do heavy household chores (wash walls, wash floors) Mild difficulty   Carry a shopping bag or briefcase No difficulty   Wash your back Mild difficulty   Use a knife to cut food No difficulty   Recreational activities in which you take some force or impact through your arm, shoulder, or hand (golf, hammering, tennis) Mild difficulty   During the past week, to what extent has your arm, shoulder or hand problem interfered with your normal social activities with family, friends, neighbors, or groups? Not at all   During the past week, to what extent has your arm, shoulder or hand problem limited your work or other regular daily activities Slightly   Arm, shoulder, or hand pain. None   Tingling (pins and needles) in your arm, shoulder, or hand Mild   Difficulty Sleeping Mild difficulty   DASH  Score 13.64 %               [facility] Adult PT Treatment/Exercise - 10/29/16 0001      Manual Therapy   Myofascial Release crosshands across right axilla with one hand on right upper arm and other at right flank; likewise with second hand at right abdomen, so in a diagonal. Unilateral pull at area of right pect insertion. Right UE myofascial pulling in supine with movement into abduction to tolerance. In left sidelying, release with one hand on proximal upper arm and other hand in opposite direction at right flank, then at right abdomen.   Passive ROM right shoulder into er, abduction and flexion in supine                        [facility] Goals - 10/29/16 1110      CC Long Term Goal  #1    Title Patient will demonstrate proper technique of home exercise program for shoulder and trunk ROM.   Time 4   Period Weeks   Status Achieved     CC Long Term Goal  #2   Title Patient reports >/= 50% less difficulty sleeping at night.   Time 4   Period Weeks   Status Achieved     CC Long Term Goal  #3   Title Pt. will be knowledgeable about performing right UE neural tension stretch, and about gradually increasing her exercise program   Time 4   Period Weeks   Status Achieved     CC Long Term Goal  #4   Title Patient will report overall pain decreased >/= 50% to tolerate sleeping and daily tasks with greater ease.   Time 4   Period Weeks   Status Achieved            Plan - 10/29/16 1146    Clinical Impression Statement Patient has made huge gains with function, reduction in pain, and improved sleep. She feels ready for discharge but was encouraged to contact us if anything changes. All goals were met and ROM is restored to baseline.   Rehab Potential Excellent   Clinical Impairments Affecting Rehab Potential None   PT Treatment/Interventions ADLs/Self Care Home Management;Patient/family education;Therapeutic exercise;Manual techniques;Scar mobilization;Passive range of motion   PT Next Visit Plan Discharge; goals met   Consulted and Agree with Plan of Care Patient      Patient will benefit from skilled therapeutic intervention in order to improve the following deficits and impairments:  Postural dysfunction, Decreased range of motion, Impaired UE functional use, Pain, Decreased scar mobility, Decreased knowledge of precautions, Decreased strength, Increased fascial restricitons  Visit Diagnosis: Aftercare following surgery for neoplasm  Pain in right arm  Carcinoma of lower-outer quadrant of right breast in female, estrogen receptor positive ([place])  Abnormal posture     Problem List Patient Active Problem List   Diagnosis Date Noted  . Malignant neoplasm of  lower-outer quadrant of right breast of female, estrogen receptor positive ([place]) 03/10/2016  . Food allergy 01/30/2015  . Abdominal pain 01/30/2015    Annia Friendly, PT 10/29/16 11:48 AM  [place], [place], [address] Phone: [phone]   Fax:  [phone]  Name: Teresa Mitchell MRN: [id number] Date of Birth: 10-31-1955  PHYSICAL THERAPY DISCHARGE SUMMARY  Visits from Start of Care: 6  Current functional level related to goals / functional outcomes: Goals met. See above.  Remaining deficits: None   Education / Equipment: Education on HEP for shoulder ROM Plan: Patient agrees to discharge.  Patient goals were met. Patient is being discharged due to meeting the stated rehab goals.  ?????     Annia Friendly, PT 10/29/16 11:48 AM

## 2017-02-01 ENCOUNTER — Ambulatory Visit
Admission: RE | Admit: 2017-02-01 | Discharge: 2017-02-01 | Disposition: A | Discharge status: Home / Self Care | Payer: BLUE CROSS/BLUE SHIELD | Source: Ambulatory Visit | Attending: Adult Health | Admitting: Adult Health

## 2017-02-01 DIAGNOSIS — Z853 Personal history of malignant neoplasm of breast: Secondary | ICD-10-CM

## 2017-02-09 DIAGNOSIS — Z23 Encounter for immunization: Secondary | ICD-10-CM | POA: Diagnosis not present

## 2017-02-09 DIAGNOSIS — R300 Dysuria: Secondary | ICD-10-CM | POA: Diagnosis not present

## 2017-02-09 DIAGNOSIS — Z0000 Encounter for general adult medical examination without abnormal findings: Secondary | ICD-10-CM | POA: Diagnosis not present

## 2017-02-09 DIAGNOSIS — E7800 Pure hypercholesterolemia, unspecified: Secondary | ICD-10-CM | POA: Diagnosis not present

## 2017-02-25 ENCOUNTER — Ambulatory Visit
Admission: RE | Admit: 2017-02-25 | Discharge: 2017-02-25 | Disposition: A | Discharge status: Home / Self Care | Payer: BLUE CROSS/BLUE SHIELD | Source: Ambulatory Visit | Attending: Adult Health | Admitting: Adult Health

## 2017-02-25 DIAGNOSIS — R922 Inconclusive mammogram: Secondary | ICD-10-CM | POA: Diagnosis not present

## 2017-02-25 HISTORY — DX: Personal history of irradiation: Z92.3

## 2017-02-25 HISTORY — DX: Malignant neoplasm of unspecified site of unspecified female breast: C50.919

## 2017-03-16 DIAGNOSIS — R300 Dysuria: Secondary | ICD-10-CM | POA: Diagnosis not present

## 2017-04-02 DIAGNOSIS — D2272 Melanocytic nevi of left lower limb, including hip: Secondary | ICD-10-CM | POA: Diagnosis not present

## 2017-04-02 DIAGNOSIS — D1801 Hemangioma of skin and subcutaneous tissue: Secondary | ICD-10-CM | POA: Diagnosis not present

## 2017-04-02 DIAGNOSIS — D225 Melanocytic nevi of trunk: Secondary | ICD-10-CM | POA: Diagnosis not present

## 2017-04-02 DIAGNOSIS — L821 Other seborrheic keratosis: Secondary | ICD-10-CM | POA: Diagnosis not present

## 2017-04-05 ENCOUNTER — Inpatient Hospital Stay: Payer: BLUE CROSS/BLUE SHIELD | Attending: Hematology and Oncology | Admitting: Hematology and Oncology

## 2017-04-05 ENCOUNTER — Telehealth: Payer: Self-pay | Admitting: Hematology and Oncology

## 2017-04-05 DIAGNOSIS — Z79811 Long term (current) use of aromatase inhibitors: Secondary | ICD-10-CM | POA: Insufficient documentation

## 2017-04-05 DIAGNOSIS — M810 Age-related osteoporosis without current pathological fracture: Secondary | ICD-10-CM | POA: Insufficient documentation

## 2017-04-05 DIAGNOSIS — Z170 Estrogen receptor positive status [ER+]: Secondary | ICD-10-CM

## 2017-04-05 DIAGNOSIS — C50511 Malignant neoplasm of lower-outer quadrant of right female breast: Secondary | ICD-10-CM | POA: Diagnosis not present

## 2017-04-05 MED ORDER — ANASTROZOLE 1 MG PO TABS: 1.0000 mg | ORAL_TABLET | Freq: Every day | ORAL | 3 refills | Status: DC

## 2017-04-05 NOTE — Progress Notes (Signed)
Patient Care Team: Kelton Pillar, MD as PCP - General (Family Medicine) Paula Compton, MD as Consulting Physician (Obstetrics and Gynecology) Erroll Luna, MD as Consulting Physician (General Surgery) Nicholas Lose, MD as Consulting Physician (Hematology and Oncology) Kyung Rudd, MD as Consulting Physician (Radiation Oncology) Delice Bison Charlestine Massed, NP as Nurse Practitioner (Hematology and Oncology)  DIAGNOSIS:  Encounter Diagnosis  Name Primary?  . Malignant neoplasm of lower-outer quadrant of right breast of female, estrogen receptor positive ([place])     SUMMARY OF ONCOLOGIC HISTORY:   Malignant neoplasm of lower-outer quadrant of right breast of female, estrogen receptor positive ([place])   03/02/2016 Initial Diagnosis    Right breast biopsy 8:00: Invasive ductal carcinoma grade 1-2, ER 90%, PR 20%, Ki-67 12%, HER-2 negative, screening detected right breast mass LOQ posterior depth 8:00 position 5 mm, axilla negative, T1 1 N0 stage IA clinical stage      04/02/2016 Surgery    Right lumpectomy ([facility]): IDC grade 2, 0.7 cm, margins negative, one benign lymph node, T1b N0 stage IA, ER 90%, PR 20%, HER-2 negative, Ki-67 12%      04/16/2016 Oncotype testing    Recurrence score: 15; ROR: 10%      05/21/2016 - 06/17/2016 Radiation Therapy    Adj XRT (moody):  1) Right breast/ 42.5 Gy in 17 fractions 2) Right breast boost/ 7.5 Gy in 3 fractions      06/2016 -  Anti-estrogen oral therapy    Anastrozole '1mg'$  daily       CHIEF COMPLIANT: Tolerating anastrozole and Fosamax fairly well  INTERVAL HISTORY: Teresa Mitchell is a 62 year old with above-mentioned history of right breast cancer treated with lumpectomy radiation is currently on anastrozole therapy.  She appears to be tolerating it fairly well.  She has occasional hot flashes as well as muscle stiffness in the morning.  She has not been exercising and has gained some weight.  REVIEW OF SYSTEMS:   Constitutional: Denies  fevers, chills or abnormal weight loss Eyes: Denies blurriness of vision Ears, nose, mouth, throat, and face: Denies mucositis or sore throat Respiratory: Denies cough, dyspnea or wheezes Cardiovascular: Denies palpitation, chest discomfort Gastrointestinal:  Denies nausea, heartburn or change in bowel habits Skin: Denies abnormal skin rashes Lymphatics: Denies new lymphadenopathy or easy bruising Neurological:Denies numbness, tingling or new weaknesses Behavioral/Psych: Mood is stable, no new changes  Extremities: No lower extremity edema Breast:  denies any pain or lumps or nodules in either breasts All other systems were reviewed with the patient and are negative.  I have reviewed the past medical history, past surgical history, social history and family history with the patient and they are unchanged from previous note.  ALLERGIES:  is allergic to no known allergies.  MEDICATIONS:  Current Outpatient Medications  Medication Sig Dispense Refill  . alendronate (FOSAMAX) 70 MG tablet Take 1 tablet (70 mg total) by mouth once a week. Take with a full glass of water on an empty stomach. 12 tablet 3  . ALPRAZolam (XANAX) 0.25 MG tablet alprazolam 0.25 mg tablet    . anastrozole (ARIMIDEX) 1 MG tablet Take 1 tablet (1 mg total) by mouth daily. 90 tablet 3  . Biotin 1 MG CAPS Take by mouth.    . diphenhydramine-acetaminophen (TYLENOL PM) 25-500 MG TABS tablet Take 1 tablet by mouth at bedtime as needed.    . Fish Oil OIL by Does not apply route.    Marland Kitchen MAGNESIUM ASPARTATE PO Take 200 mg by mouth 2 (two) times daily.     Marland Kitchen  oxyCODONE-acetaminophen (ROXICET) 5-325 MG tablet Take 1-2 tablets by mouth every 4 (four) hours as needed. (Patient not taking: Reported on 08/13/2016) 15 tablet 0  . pravastatin (PRAVACHOL) 20 MG tablet Take 20 mg by mouth daily.    . Turmeric 500 MG CAPS Take 500 mg by mouth every morning.    . vitamin B-12 (CYANOCOBALAMIN) 100 MCG tablet Take 3,000 mcg by mouth daily.       No current facility-administered medications for this visit.     PHYSICAL EXAMINATION: ECOG PERFORMANCE STATUS: 1 - Symptomatic but completely ambulatory  Vitals:   04/05/17 1025  BP: 119/78  Pulse: 61  Resp: 18  Temp: 98.2 F (36.8 C)  SpO2: 100%   Filed Weights   04/05/17 1025  Weight: 147 lb 8 oz (66.9 kg)    GENERAL:alert, no distress and comfortable SKIN: skin color, texture, turgor are normal, no rashes or significant lesions EYES: normal, Conjunctiva are pink and non-injected, sclera clear OROPHARYNX:no exudate, no erythema and lips, buccal mucosa, and tongue normal  NECK: supple, thyroid normal size, non-tender, without nodularity LYMPH:  no palpable lymphadenopathy in the cervical, axillary or inguinal LUNGS: clear to auscultation and percussion with normal breathing effort HEART: regular rate & rhythm and no murmurs and no lower extremity edema ABDOMEN:abdomen soft, non-tender and normal bowel sounds MUSCULOSKELETAL:no cyanosis of digits and no clubbing  NEURO: alert & oriented x 3 with fluent speech, no focal motor/sensory deficits EXTREMITIES: No lower extremity edema BREAST: No palpable masses or nodules in either right or left breasts. No palpable axillary supraclavicular or infraclavicular adenopathy no breast tenderness or nipple discharge. (exam performed in the presence of a chaperone)  LABORATORY DATA:  I have reviewed the data as listed CMP Latest Ref Rng & Units 03/11/2016  Glucose 70 - 140 mg/dl 99  BUN 7.0 - 26.0 mg/dL 9.1  Creatinine 0.6 - 1.1 mg/dL 0.7  Sodium 136 - 145 mEq/L 141  Potassium 3.5 - 5.1 mEq/L 3.9  CO2 22 - 29 mEq/L 26  Calcium 8.4 - 10.4 mg/dL 9.9  Total Protein 6.4 - 8.3 g/dL 7.4  Total Bilirubin 0.20 - 1.20 mg/dL 0.95  Alkaline Phos 40 - 150 U/L 60  AST 5 - 34 U/L 21  ALT 0 - 55 U/L 25    Lab Results  Component Value Date   WBC 5.0 03/11/2016   HGB 13.9 03/11/2016   HCT 41.8 03/11/2016   MCV 89.8 03/11/2016   PLT 177  03/11/2016   NEUTROABS 2.8 03/11/2016    ASSESSMENT & PLAN:  Malignant neoplasm of lower-outer quadrant of right breast of female, estrogen receptor positive ([place]) Right lumpectomy 04/02/2016: IDC grade 2, 0.7 cm, margins negative, one benign lymph node, T1b N0 stage IA, ER 90%, PR 20%, HER-2 negative, Ki-67 12  Radiation: Completed 06/17/16. Treatment plan: Anastrozole 1 mg daily started June 2018  Anastrozole Toxicities: Occasional hot flashes  Breast cancer surveillance: 1.  Mammogram 02/25/2017: Benign breast density category B 2. breast exam 04/05/2017: Benign   Osteoporosis T score -2.30 June 2016: Started the patient on Fosamax along with calcium and vitamin D.  Patient will need a new bone density test in June 2020  RTC in 1 year   I spent 25 minutes talking to the patient of which more than half was spent in counseling and coordination of care.  No orders of the defined types were placed in this encounter.  The patient has a good understanding of the overall plan. she agrees with  it. she will call with any problems that may develop before the next visit here.   Harriette Ohara, MD 04/05/17

## 2017-04-05 NOTE — Telephone Encounter (Signed)
Gave patient AVS and calendar of upcoming march 2020 appointments.

## 2017-04-05 NOTE — Assessment & Plan Note (Addendum)
Right lumpectomy 04/02/2016: IDC grade 2, 0.7 cm, margins negative, one benign lymph node, T1b N0 stage IA, ER 90%, PR 20%, HER-2 negative, Ki-67 12  Radiation: Completed 06/17/16. Treatment plan: Anastrozole 1 mg daily started June 2018  Anastrozole Toxicities: Occasional hot flashes  Osteoporosis T score -2.30 June 2016: Started the patient on Fosamax along with calcium and vitamin D.   RTC in 1 year 

## 2017-04-13 DIAGNOSIS — Z124 Encounter for screening for malignant neoplasm of cervix: Secondary | ICD-10-CM | POA: Diagnosis not present

## 2017-04-13 DIAGNOSIS — Z130 Encounter for screening for diseases of the blood and blood-forming organs and certain disorders involving the immune mechanism: Secondary | ICD-10-CM | POA: Diagnosis not present

## 2017-04-13 DIAGNOSIS — Z6828 Body mass index (BMI) 28.0-28.9, adult: Secondary | ICD-10-CM | POA: Diagnosis not present

## 2017-04-13 DIAGNOSIS — Z01419 Encounter for gynecological examination (general) (routine) without abnormal findings: Secondary | ICD-10-CM | POA: Diagnosis not present

## 2017-04-13 DIAGNOSIS — Z1389 Encounter for screening for other disorder: Secondary | ICD-10-CM | POA: Diagnosis not present

## 2017-04-14 DIAGNOSIS — Z124 Encounter for screening for malignant neoplasm of cervix: Secondary | ICD-10-CM | POA: Diagnosis not present

## 2017-05-03 DIAGNOSIS — Z853 Personal history of malignant neoplasm of breast: Secondary | ICD-10-CM | POA: Diagnosis not present

## 2017-05-07 IMAGING — MG MM PLC BREAST LOC DEV 1ST LESION INC*R*
2 series · 2 of 2 positions shown · non-contrast
Comparison: Previous exam(s).

CLINICAL DATA: Patient for preoperative localization prior to right
lumpectomy.

EXAM:
MAMMOGRAPHIC GUIDED RADIOACTIVE SEED LOCALIZATION OF THE RIGHT
BREAST

[R ML]
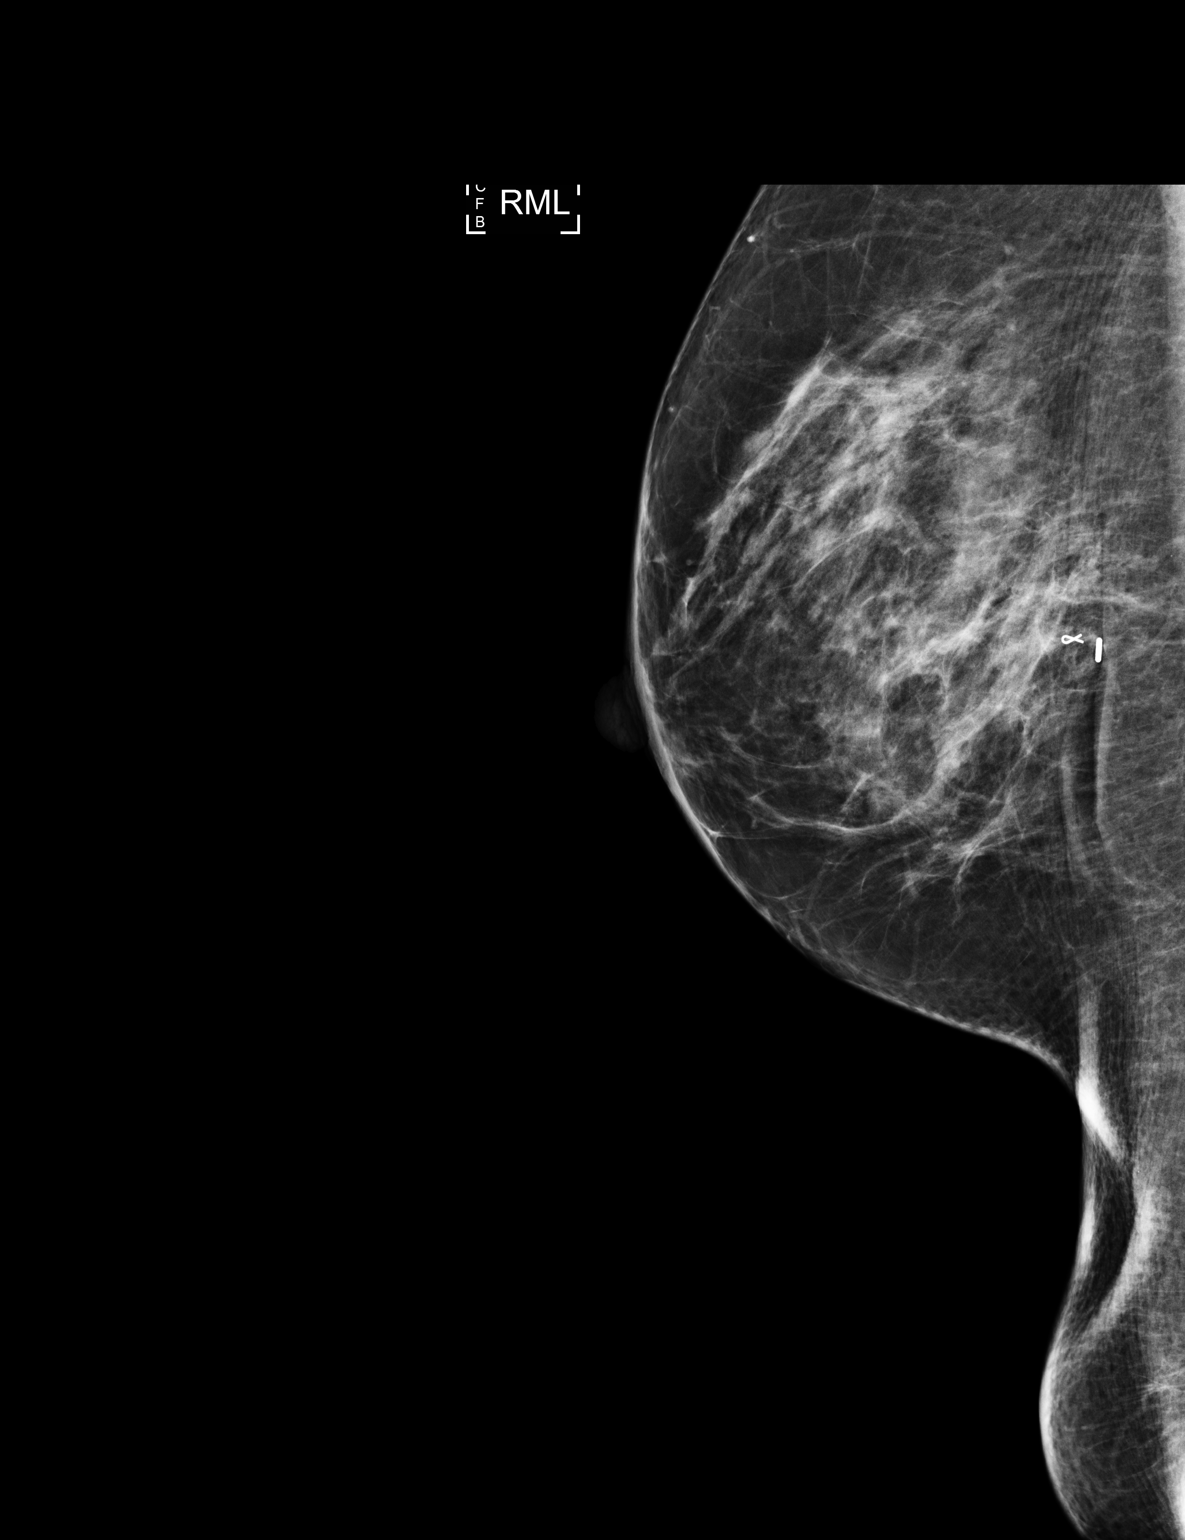

[R CC]
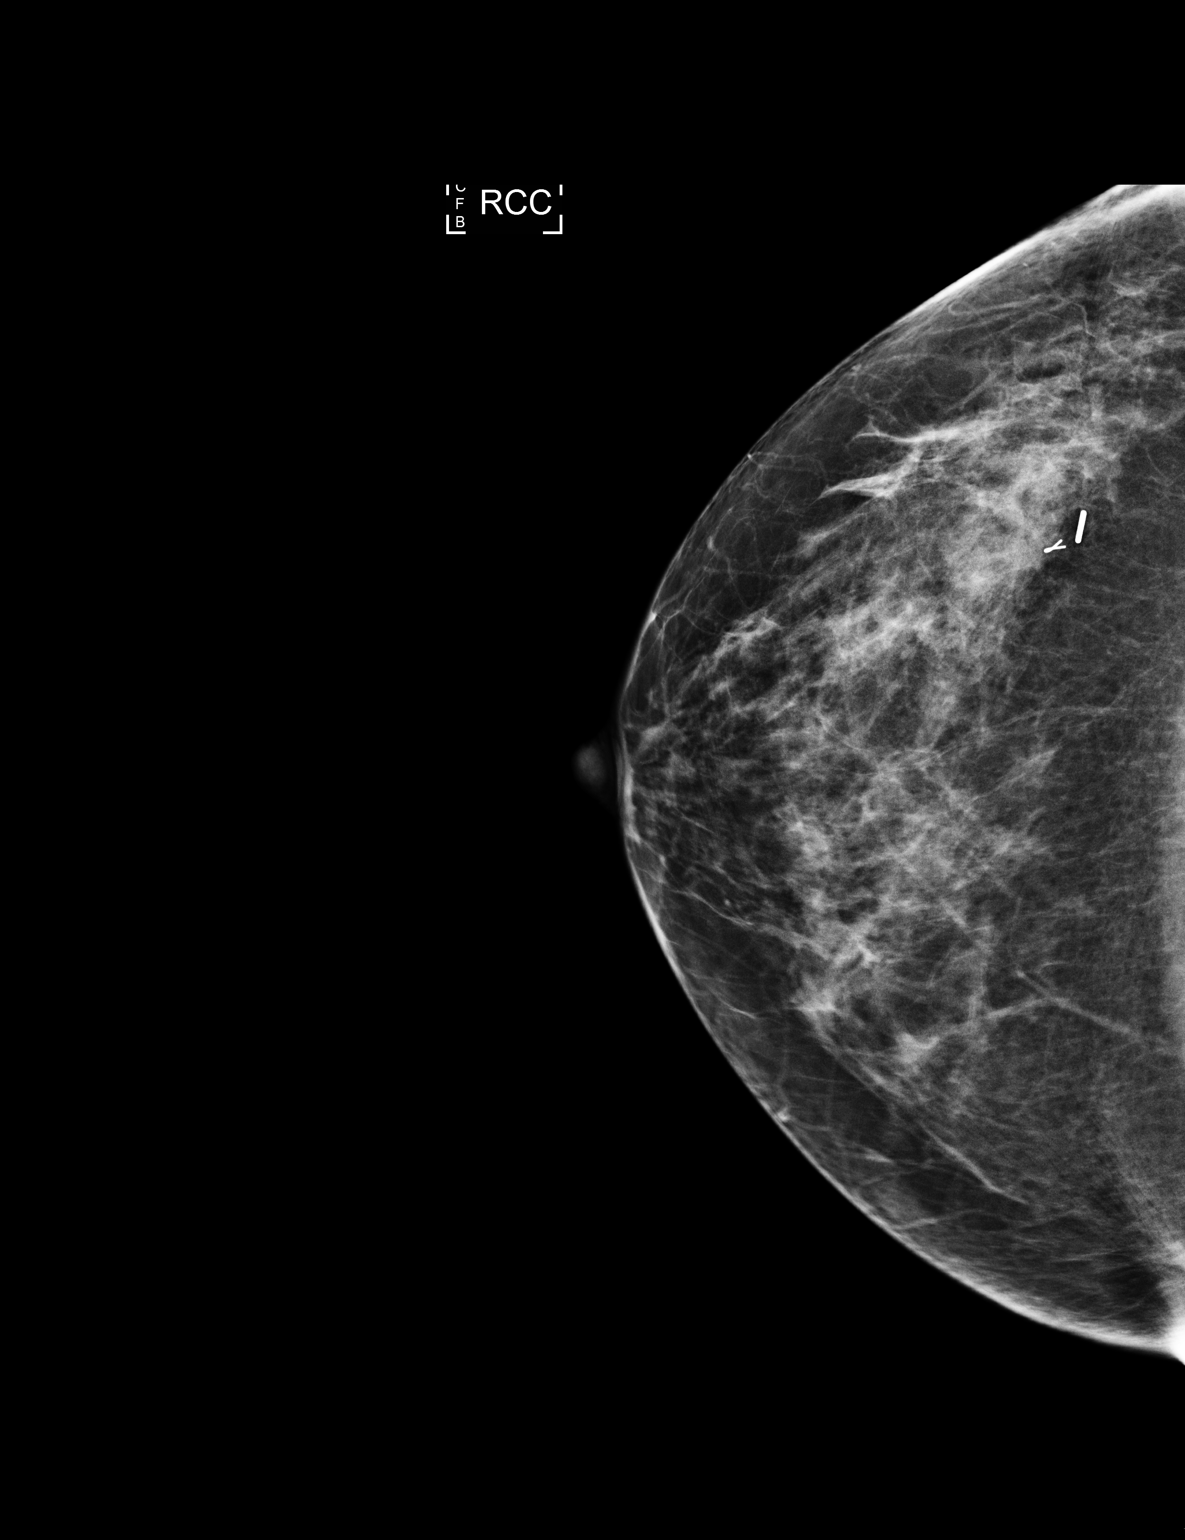

[2 of 2 positions shown; findings below may reference images not displayed]

FINDINGS: Patient presents for radioactive seed localization prior to right
breast lumpectomy. I met with the patient and we discussed the
procedure of seed localization including benefits and alternatives.
We discussed the high likelihood of a successful procedure. We
discussed the risks of the procedure including infection, bleeding,
tissue injury and further surgery. We discussed the low dose of
radioactivity involved in the procedure. Informed, written consent
was given.

The usual time-out protocol was performed immediately prior to the
procedure.

Using mammographic guidance, sterile technique, 1% lidocaine and an
9-BFT radioactive seed, biopsy marking clip within the lateral right
breast was localized using a lateral approach. The follow-up
mammogram images confirm the seed in the expected location and were
marked for Dr. Paakane.

Follow-up survey of the patient confirms presence of the radioactive
seed.

Order number of 9-BFT seed:  [id number].

Total activity:  0.249 millicuries  Reference Date: 03/10/2016

The patient tolerated the procedure well and was released from the
[REDACTED]. She was given instructions regarding seed removal.
IMPRESSION: Radioactive seed localization right breast. No apparent
complications.

## 2017-08-11 ENCOUNTER — Other Ambulatory Visit: Payer: Self-pay | Admitting: Hematology and Oncology

## 2017-10-21 DIAGNOSIS — E7800 Pure hypercholesterolemia, unspecified: Secondary | ICD-10-CM | POA: Diagnosis not present

## 2018-02-14 DIAGNOSIS — M1990 Unspecified osteoarthritis, unspecified site: Secondary | ICD-10-CM | POA: Diagnosis not present

## 2018-02-14 DIAGNOSIS — K219 Gastro-esophageal reflux disease without esophagitis: Secondary | ICD-10-CM | POA: Diagnosis not present

## 2018-02-14 DIAGNOSIS — J309 Allergic rhinitis, unspecified: Secondary | ICD-10-CM | POA: Diagnosis not present

## 2018-02-14 DIAGNOSIS — Z0000 Encounter for general adult medical examination without abnormal findings: Secondary | ICD-10-CM | POA: Diagnosis not present

## 2018-02-14 DIAGNOSIS — Z23 Encounter for immunization: Secondary | ICD-10-CM | POA: Diagnosis not present

## 2018-02-21 DIAGNOSIS — H938X3 Other specified disorders of ear, bilateral: Secondary | ICD-10-CM | POA: Diagnosis not present

## 2018-02-21 DIAGNOSIS — H6691 Otitis media, unspecified, right ear: Secondary | ICD-10-CM | POA: Diagnosis not present

## 2018-03-07 DIAGNOSIS — H9011 Conductive hearing loss, unilateral, right ear, with unrestricted hearing on the contralateral side: Secondary | ICD-10-CM | POA: Diagnosis not present

## 2018-03-07 DIAGNOSIS — H6981 Other specified disorders of Eustachian tube, right ear: Secondary | ICD-10-CM | POA: Diagnosis not present

## 2018-03-07 DIAGNOSIS — H6501 Acute serous otitis media, right ear: Secondary | ICD-10-CM | POA: Diagnosis not present

## 2018-03-28 DIAGNOSIS — H9011 Conductive hearing loss, unilateral, right ear, with unrestricted hearing on the contralateral side: Secondary | ICD-10-CM | POA: Diagnosis not present

## 2018-03-28 DIAGNOSIS — H6981 Other specified disorders of Eustachian tube, right ear: Secondary | ICD-10-CM | POA: Diagnosis not present

## 2018-03-28 DIAGNOSIS — H6501 Acute serous otitis media, right ear: Secondary | ICD-10-CM | POA: Diagnosis not present

## 2018-04-04 ENCOUNTER — Telehealth: Payer: Self-pay | Admitting: Hematology and Oncology

## 2018-04-04 ENCOUNTER — Inpatient Hospital Stay: Payer: BLUE CROSS/BLUE SHIELD | Attending: Hematology and Oncology | Admitting: Hematology and Oncology

## 2018-04-04 DIAGNOSIS — Z79811 Long term (current) use of aromatase inhibitors: Secondary | ICD-10-CM | POA: Diagnosis not present

## 2018-04-04 DIAGNOSIS — Z170 Estrogen receptor positive status [ER+]: Secondary | ICD-10-CM

## 2018-04-04 DIAGNOSIS — C50511 Malignant neoplasm of lower-outer quadrant of right female breast: Secondary | ICD-10-CM | POA: Diagnosis not present

## 2018-04-04 DIAGNOSIS — Z923 Personal history of irradiation: Secondary | ICD-10-CM | POA: Insufficient documentation

## 2018-04-04 DIAGNOSIS — M810 Age-related osteoporosis without current pathological fracture: Secondary | ICD-10-CM | POA: Diagnosis not present

## 2018-04-04 MED ORDER — CALCIUM CARBONATE-VITAMIN D 500-200 MG-UNIT PO TABS: 1.0000 | ORAL_TABLET | Freq: Two times a day (BID) | ORAL | Status: AC

## 2018-04-04 MED ORDER — ANASTROZOLE 1 MG PO TABS: 1.0000 mg | ORAL_TABLET | Freq: Every day | ORAL | 3 refills | Status: DC

## 2018-04-04 NOTE — Assessment & Plan Note (Signed)
Right lumpectomy 04/02/2016: IDC grade 2, 0.7 cm, margins negative, one benign lymph node, T1b N0 stage IA, ER 90%, PR 20%, HER-2 negative, Ki-67 12  Radiation: Completed 06/17/16. Treatment plan: Anastrozole 1 mg daily started June 2018  Anastrozole Toxicities: Occasional hot flashes  Breast cancer surveillance: 1.  Mammogram 02/25/2017: Benign breast density category B, patient will need another mammogram this year. 2. breast exam 04/04/2018: Benign   Osteoporosis T score -2.30 June 2016: Started the patient on Fosamax along with calcium and vitamin D.  Patient will need a new bone density test in June 2020  RTC in 1 year

## 2018-04-04 NOTE — Telephone Encounter (Signed)
Gave avs and calendar ° °

## 2018-04-04 NOTE — Progress Notes (Signed)
Patient Care Team: Kelton Pillar, MD as PCP - General (Family Medicine) Paula Compton, MD as Consulting Physician (Obstetrics and Gynecology) Erroll Luna, MD as Consulting Physician (General Surgery) Nicholas Lose, MD as Consulting Physician (Hematology and Oncology) Kyung Rudd, MD as Consulting Physician (Radiation Oncology) Delice Bison Charlestine Massed, NP as Nurse Practitioner (Hematology and Oncology)  DIAGNOSIS:  Encounter Diagnosis  Name Primary?  . Malignant neoplasm of lower-outer quadrant of right breast of female, estrogen receptor positive ([place])     SUMMARY OF ONCOLOGIC HISTORY:   Malignant neoplasm of lower-outer quadrant of right breast of female, estrogen receptor positive ([place])   03/02/2016 Initial Diagnosis    Right breast biopsy 8:00: Invasive ductal carcinoma grade 1-2, ER 90%, PR 20%, Ki-67 12%, HER-2 negative, screening detected right breast mass LOQ posterior depth 8:00 position 5 mm, axilla negative, T1 1 N0 stage IA clinical stage    04/02/2016 Surgery    Right lumpectomy ([facility]): IDC grade 2, 0.7 cm, margins negative, one benign lymph node, T1b N0 stage IA, ER 90%, PR 20%, HER-2 negative, Ki-67 12%    04/16/2016 Oncotype testing    Recurrence score: 15; ROR: 10%    05/21/2016 - 06/17/2016 Radiation Therapy    Adj XRT (moody):  1) Right breast/ 42.5 Gy in 17 fractions 2) Right breast boost/ 7.5 Gy in 3 fractions    06/2016 -  Anti-estrogen oral therapy    Anastrozole '1mg'$  daily     CHIEF COMPLIANT: Follow-up on anastrozole therapy  INTERVAL HISTORY: Teresa Mitchell is a 62 year old with above-mentioned history of right breast cancer is currently on anastrozole and tolerating it extremely well.  She has moderate degree of hot flashes.  Denies any arthralgias or myalgias.  REVIEW OF SYSTEMS:   Constitutional: Denies fevers, chills or abnormal weight loss Eyes: Denies blurriness of vision Ears, nose, mouth, throat, and face: Denies mucositis or sore  throat Respiratory: Denies cough, dyspnea or wheezes Cardiovascular: Denies palpitation, chest discomfort Gastrointestinal:  Denies nausea, heartburn or change in bowel habits Skin: Denies abnormal skin rashes Lymphatics: Denies new lymphadenopathy or easy bruising Neurological:Denies numbness, tingling or new weaknesses Behavioral/Psych: Mood is stable, no new changes  Extremities: No lower extremity edema Breast:  denies any pain or lumps or nodules in either breasts All other systems were reviewed with the patient and are negative.  I have reviewed the past medical history, past surgical history, social history and family history with the patient and they are unchanged from previous note.  ALLERGIES:  is allergic to no known allergies.  MEDICATIONS:  Current Outpatient Medications  Medication Sig Dispense Refill  . alendronate (FOSAMAX) 70 MG tablet TAKE 1 TABLET BY MOUTH ONCE WEEKLY BEFORE BREAKFAST, ON AN EMPTY STOMACH: REMAIN UPRIGHT FOR 30 MINUTES:TAKE WITH 8 OUNCES OF WATER 12 tablet 2  . ALPRAZolam (XANAX) 0.25 MG tablet alprazolam 0.25 mg tablet    . anastrozole (ARIMIDEX) 1 MG tablet Take 1 tablet (1 mg total) by mouth daily. 90 tablet 3  . Biotin 1 MG CAPS Take by mouth.    . diphenhydramine-acetaminophen (TYLENOL PM) 25-500 MG TABS tablet Take 1 tablet by mouth at bedtime as needed.    . Fish Oil OIL by Does not apply route.    Marland Kitchen MAGNESIUM ASPARTATE PO Take 200 mg by mouth 2 (two) times daily.     Marland Kitchen oxyCODONE-acetaminophen (ROXICET) 5-325 MG tablet Take 1-2 tablets by mouth every 4 (four) hours as needed. (Patient not taking: Reported on 08/13/2016) 15 tablet 0  .  pravastatin (PRAVACHOL) 20 MG tablet Take 20 mg by mouth daily.    . Turmeric 500 MG CAPS Take 500 mg by mouth every morning.    . vitamin B-12 (CYANOCOBALAMIN) 100 MCG tablet Take 3,000 mcg by mouth daily.      No current facility-administered medications for this visit.     PHYSICAL EXAMINATION: ECOG  PERFORMANCE STATUS: 0 - Asymptomatic  Vitals:   04/04/18 1020  BP: 107/75  Pulse: 69  Resp: 18  Temp: 97.8 F (36.6 C)  SpO2: 98%   Filed Weights   04/04/18 1020  Weight: 141 lb 9.6 oz (64.2 kg)    GENERAL:alert, no distress and comfortable SKIN: skin color, texture, turgor are normal, no rashes or significant lesions EYES: normal, Conjunctiva are pink and non-injected, sclera clear OROPHARYNX:no exudate, no erythema and lips, buccal mucosa, and tongue normal  NECK: supple, thyroid normal size, non-tender, without nodularity LYMPH:  no palpable lymphadenopathy in the cervical, axillary or inguinal LUNGS: clear to auscultation and percussion with normal breathing effort HEART: regular rate & rhythm and no murmurs and no lower extremity edema ABDOMEN:abdomen soft, non-tender and normal bowel sounds MUSCULOSKELETAL:no cyanosis of digits and no clubbing  NEURO: alert & oriented x 3 with fluent speech, no focal motor/sensory deficits EXTREMITIES: No lower extremity edema BREAST: No palpable masses or nodules in either right or left breasts. No palpable axillary supraclavicular or infraclavicular adenopathy no breast tenderness or nipple discharge. (exam performed in the presence of a chaperone)  LABORATORY DATA:  I have reviewed the data as listed CMP Latest Ref Rng & Units 03/11/2016  Glucose 70 - 140 mg/dl 99  BUN 7.0 - 26.0 mg/dL 9.1  Creatinine 0.6 - 1.1 mg/dL 0.7  Sodium 136 - 145 mEq/L 141  Potassium 3.5 - 5.1 mEq/L 3.9  CO2 22 - 29 mEq/L 26  Calcium 8.4 - 10.4 mg/dL 9.9  Total Protein 6.4 - 8.3 g/dL 7.4  Total Bilirubin 0.20 - 1.20 mg/dL 0.95  Alkaline Phos 40 - 150 U/L 60  AST 5 - 34 U/L 21  ALT 0 - 55 U/L 25    Lab Results  Component Value Date   WBC 5.0 03/11/2016   HGB 13.9 03/11/2016   HCT 41.8 03/11/2016   MCV 89.8 03/11/2016   PLT 177 03/11/2016   NEUTROABS 2.8 03/11/2016    ASSESSMENT & PLAN:  Malignant neoplasm of lower-outer quadrant of right  breast of female, estrogen receptor positive ([place]) Right lumpectomy 04/02/2016: IDC grade 2, 0.7 cm, margins negative, one benign lymph node, T1b N0 stage IA, ER 90%, PR 20%, HER-2 negative, Ki-67 12  Radiation: Completed 06/17/16. Treatment plan: Anastrozole 1 mg daily started June 2018  Anastrozole Toxicities: Occasional hot flashes  Breast cancer surveillance: 1.  Mammogram 02/25/2017: Benign breast density category B, patient will need another mammogram this year. 2. breast exam 04/04/2018: Benign   Osteoporosis T score -2.30 June 2016: Started the patient on Fosamax along with calcium and vitamin D.  Patient will need a new bone density test in June 2020  RTC in 1 year    No orders of the defined types were placed in this encounter.  The patient has a good understanding of the overall plan. she agrees with it. she will call with any problems that may develop before the next visit here.   Harriette Ohara, MD 04/04/18

## 2018-04-12 ENCOUNTER — Ambulatory Visit
Admission: RE | Admit: 2018-04-12 | Discharge: 2018-04-12 | Disposition: A | Discharge status: Home / Self Care | Payer: BLUE CROSS/BLUE SHIELD | Source: Ambulatory Visit | Attending: Hematology and Oncology | Admitting: Hematology and Oncology

## 2018-04-12 ENCOUNTER — Other Ambulatory Visit: Payer: Self-pay

## 2018-04-12 ENCOUNTER — Other Ambulatory Visit: Payer: Self-pay | Admitting: Hematology and Oncology

## 2018-04-12 DIAGNOSIS — C50511 Malignant neoplasm of lower-outer quadrant of right female breast: Secondary | ICD-10-CM

## 2018-04-12 DIAGNOSIS — Z170 Estrogen receptor positive status [ER+]: Principal | ICD-10-CM

## 2018-04-12 DIAGNOSIS — R922 Inconclusive mammogram: Secondary | ICD-10-CM | POA: Diagnosis not present

## 2018-04-12 DIAGNOSIS — N644 Mastodynia: Secondary | ICD-10-CM | POA: Diagnosis not present

## 2018-04-12 DIAGNOSIS — Z853 Personal history of malignant neoplasm of breast: Secondary | ICD-10-CM | POA: Diagnosis not present

## 2018-05-13 ENCOUNTER — Other Ambulatory Visit: Payer: Self-pay | Admitting: Hematology and Oncology

## 2018-08-01 DIAGNOSIS — Z853 Personal history of malignant neoplasm of breast: Secondary | ICD-10-CM | POA: Diagnosis not present

## 2018-08-09 ENCOUNTER — Other Ambulatory Visit: Payer: Self-pay | Admitting: Hematology and Oncology

## 2018-09-05 DIAGNOSIS — Z1389 Encounter for screening for other disorder: Secondary | ICD-10-CM | POA: Diagnosis not present

## 2018-09-05 DIAGNOSIS — Z6827 Body mass index (BMI) 27.0-27.9, adult: Secondary | ICD-10-CM | POA: Diagnosis not present

## 2018-09-05 DIAGNOSIS — Z01419 Encounter for gynecological examination (general) (routine) without abnormal findings: Secondary | ICD-10-CM | POA: Diagnosis not present

## 2018-10-30 ENCOUNTER — Other Ambulatory Visit: Payer: Self-pay | Admitting: Hematology and Oncology

## 2019-01-31 ENCOUNTER — Other Ambulatory Visit: Payer: Self-pay | Admitting: Hematology and Oncology

## 2019-02-16 ENCOUNTER — Other Ambulatory Visit: Payer: Self-pay | Admitting: Family Medicine

## 2019-02-16 DIAGNOSIS — Z853 Personal history of malignant neoplasm of breast: Secondary | ICD-10-CM

## 2019-02-16 DIAGNOSIS — Z0000 Encounter for general adult medical examination without abnormal findings: Secondary | ICD-10-CM | POA: Diagnosis not present

## 2019-02-16 DIAGNOSIS — Z9889 Other specified postprocedural states: Secondary | ICD-10-CM

## 2019-02-16 DIAGNOSIS — M8580 Other specified disorders of bone density and structure, unspecified site: Secondary | ICD-10-CM

## 2019-02-21 ENCOUNTER — Other Ambulatory Visit: Payer: BLUE CROSS/BLUE SHIELD

## 2019-03-01 ENCOUNTER — Telehealth: Payer: Self-pay | Admitting: Hematology and Oncology

## 2019-03-01 NOTE — Telephone Encounter (Signed)
Rescheduled per 2/2 sch msg, pt req. Called and spoke with pt, confirmed 4/12 appt

## 2019-03-21 DIAGNOSIS — M8580 Other specified disorders of bone density and structure, unspecified site: Secondary | ICD-10-CM | POA: Diagnosis not present

## 2019-03-21 DIAGNOSIS — Z23 Encounter for immunization: Secondary | ICD-10-CM | POA: Diagnosis not present

## 2019-03-21 DIAGNOSIS — M8588 Other specified disorders of bone density and structure, other site: Secondary | ICD-10-CM | POA: Diagnosis not present

## 2019-03-21 DIAGNOSIS — E7800 Pure hypercholesterolemia, unspecified: Secondary | ICD-10-CM | POA: Diagnosis not present

## 2019-04-04 ENCOUNTER — Ambulatory Visit: Payer: BLUE CROSS/BLUE SHIELD | Admitting: Hematology and Oncology

## 2019-04-13 ENCOUNTER — Other Ambulatory Visit: Payer: Self-pay

## 2019-04-13 ENCOUNTER — Ambulatory Visit
Admission: RE | Admit: 2019-04-13 | Discharge: 2019-04-13 | Disposition: A | Discharge status: Home / Self Care | Payer: Medicare Other | Source: Ambulatory Visit | Attending: Family Medicine | Admitting: Family Medicine

## 2019-04-13 DIAGNOSIS — R922 Inconclusive mammogram: Secondary | ICD-10-CM | POA: Diagnosis not present

## 2019-04-13 DIAGNOSIS — Z9889 Other specified postprocedural states: Secondary | ICD-10-CM

## 2019-04-13 DIAGNOSIS — Z853 Personal history of malignant neoplasm of breast: Secondary | ICD-10-CM

## 2019-04-29 ENCOUNTER — Other Ambulatory Visit: Payer: Self-pay | Admitting: Hematology and Oncology

## 2019-05-01 ENCOUNTER — Other Ambulatory Visit: Payer: Self-pay | Admitting: Hematology and Oncology

## 2019-05-04 ENCOUNTER — Other Ambulatory Visit: Payer: Self-pay

## 2019-05-04 ENCOUNTER — Ambulatory Visit
Admission: RE | Admit: 2019-05-04 | Discharge: 2019-05-04 | Disposition: A | Discharge status: Home / Self Care | Payer: Medicare Other | Source: Ambulatory Visit | Attending: Family Medicine | Admitting: Family Medicine

## 2019-05-04 DIAGNOSIS — Z780 Asymptomatic menopausal state: Secondary | ICD-10-CM | POA: Diagnosis not present

## 2019-05-04 DIAGNOSIS — M8580 Other specified disorders of bone density and structure, unspecified site: Secondary | ICD-10-CM

## 2019-05-04 DIAGNOSIS — M810 Age-related osteoporosis without current pathological fracture: Secondary | ICD-10-CM | POA: Diagnosis not present

## 2019-05-04 DIAGNOSIS — M85851 Other specified disorders of bone density and structure, right thigh: Secondary | ICD-10-CM | POA: Diagnosis not present

## 2019-05-07 NOTE — Progress Notes (Signed)
Patient Care Team: Kelton Pillar, MD as PCP - General (Family Medicine) Paula Compton, MD as Consulting Physician (Obstetrics and Gynecology) Erroll Luna, MD as Consulting Physician (General Surgery) Nicholas Lose, MD as Consulting Physician (Hematology and Oncology) Kyung Rudd, MD as Consulting Physician (Radiation Oncology) Gardenia Phlegm, NP as Nurse Practitioner (Hematology and Oncology)  DIAGNOSIS:    ICD-10-CM   1. Malignant neoplasm of lower-outer quadrant of right breast of female, estrogen receptor positive ([place])  C50.511    Z17.0     SUMMARY OF ONCOLOGIC HISTORY: Oncology History  Malignant neoplasm of lower-outer quadrant of right breast of female, estrogen receptor positive ([place])  03/02/2016 Initial Diagnosis   Right breast biopsy 8:00: Invasive ductal carcinoma grade 1-2, ER 90%, PR 20%, Ki-67 12%, HER-2 negative, screening detected right breast mass LOQ posterior depth 8:00 position 5 mm, axilla negative, T1 1 N0 stage IA clinical stage   04/02/2016 Surgery   Right lumpectomy ([facility]): IDC grade 2, 0.7 cm, margins negative, one benign lymph node, T1b N0 stage IA, ER 90%, PR 20%, HER-2 negative, Ki-67 12%   04/16/2016 Oncotype testing   Recurrence score: 15; ROR: 10%   05/21/2016 - 06/17/2016 Radiation Therapy   Adj XRT (moody):  1) Right breast/ 42.5 Gy in 17 fractions 2) Right breast boost/ 7.5 Gy in 3 fractions   06/2016 -  Anti-estrogen oral therapy   Anastrozole '1mg'$  daily     CHIEF COMPLIANT: Follow-up pf right breast cancer on anastrozole therapy  INTERVAL HISTORY: Teresa Mitchell is a 64 y.o. with above-mentioned history of right breast cancer treated with lumpectomy, radiation, and is currently on antiestrogen therapy with anastrozole. Mammogram on 04/13/19 showed no evidence of malignancy bilaterally. Bone density scan on 05/04/19 showed osteoporosis with a T-score of -2.5 at the [facility]. She presents to the clinic today for annual follow-up.    ALLERGIES:  is allergic to no known allergies.  MEDICATIONS:  Current Outpatient Medications  Medication Sig Dispense Refill  . alendronate (FOSAMAX) 70 MG tablet TAKE ONE TABLET BY MOUTH ONCE WEEKLY ON AN EMPTY STOMACH BEFORE BREAKFAST. REMAIN UPRIGHT FOR 30 MINUTES AND TAKE WITH 8 OZ OF WATER 12 tablet 0  . anastrozole (ARIMIDEX) 1 MG tablet TAKE ONE TABLET BY MOUTH DAILY 90 tablet 0  . Biotin 1 MG CAPS Take by mouth.    . calcium-vitamin D (OSCAL WITH D) 500-200 MG-UNIT tablet Take 1 tablet by mouth 2 (two) times daily.    . diphenhydramine-acetaminophen (TYLENOL PM) 25-500 MG TABS tablet Take 1 tablet by mouth at bedtime as needed.    . Fish Oil OIL by Does not apply route.    Marland Kitchen MAGNESIUM ASPARTATE PO Take 200 mg by mouth 2 (two) times daily.     . pravastatin (PRAVACHOL) 20 MG tablet Take 20 mg by mouth daily.    . Turmeric 500 MG CAPS Take 500 mg by mouth every morning.    . vitamin B-12 (CYANOCOBALAMIN) 100 MCG tablet Take 3,000 mcg by mouth daily.      No current facility-administered medications for this visit.    PHYSICAL EXAMINATION: ECOG PERFORMANCE STATUS: 1 - Symptomatic but completely ambulatory  Vitals:   05/08/19 1059  BP: 119/77  Pulse: 68  Resp: 18  Temp: 98.7 F (37.1 C)  SpO2: 100%   Filed Weights   05/08/19 1059  Weight: 141 lb 14.4 oz (64.4 kg)    BREAST: No palpable masses or nodules in either right or left breasts. No palpable  axillary supraclavicular or infraclavicular adenopathy no breast tenderness or nipple discharge. (exam performed in the presence of a chaperone)  LABORATORY DATA:  I have reviewed the data as listed CMP Latest Ref Rng & Units 03/11/2016  Glucose 70 - 140 mg/dl 99  BUN 7.0 - 26.0 mg/dL 9.1  Creatinine 0.6 - 1.1 mg/dL 0.7  Sodium 136 - 145 mEq/L 141  Potassium 3.5 - 5.1 mEq/L 3.9  CO2 22 - 29 mEq/L 26  Calcium 8.4 - 10.4 mg/dL 9.9  Total Protein 6.4 - 8.3 g/dL 7.4  Total Bilirubin 0.20 - 1.20 mg/dL 0.95  Alkaline Phos  40 - 150 U/L 60  AST 5 - 34 U/L 21  ALT 0 - 55 U/L 25    Lab Results  Component Value Date   WBC 5.0 03/11/2016   HGB 13.9 03/11/2016   HCT 41.8 03/11/2016   MCV 89.8 03/11/2016   PLT 177 03/11/2016   NEUTROABS 2.8 03/11/2016    ASSESSMENT & PLAN:  Malignant neoplasm of lower-outer quadrant of right breast of female, estrogen receptor positive ([place]) Right lumpectomy 04/02/2016: IDC grade 2, 0.7 cm, margins negative, one benign lymph node, T1b N0 stage IA, ER 90%, PR 20%, HER-2 negative, Ki-67 12  Radiation: Completed 06/17/16. Treatment plan: Anastrozole 1 mg daily started June 2018  Anastrozole Toxicities: Occasional hot flashes  Breast cancer surveillance: 1.Mammogram 04/02/2018: Benign breast density category B 2.breast exam  05/08/2019: Benign   Osteoporosis T score -2.5 05/04/2019: Continue with Fosamax along with calcium and vitamin D. RTC in 1 year    No orders of the defined types were placed in this encounter.  The patient has a good understanding of the overall plan. she agrees with it. she will call with any problems that may develop before the next visit here.  Total time spent: 20 mins including face to face time and time spent for planning, charting and coordination of care  Nicholas Lose, MD 05/08/2019  I, Cloyde Reams Dorshimer, am acting as scribe for Dr. Nicholas Lose.  I have reviewed the above documentation for accuracy and completeness, and I agree with the above.

## 2019-05-08 ENCOUNTER — Other Ambulatory Visit: Payer: Self-pay

## 2019-05-08 ENCOUNTER — Inpatient Hospital Stay: Payer: Medicare Other | Attending: Hematology and Oncology | Admitting: Hematology and Oncology

## 2019-05-08 DIAGNOSIS — Z923 Personal history of irradiation: Secondary | ICD-10-CM | POA: Insufficient documentation

## 2019-05-08 DIAGNOSIS — Z79899 Other long term (current) drug therapy: Secondary | ICD-10-CM | POA: Diagnosis not present

## 2019-05-08 DIAGNOSIS — C50511 Malignant neoplasm of lower-outer quadrant of right female breast: Secondary | ICD-10-CM

## 2019-05-08 DIAGNOSIS — Z170 Estrogen receptor positive status [ER+]: Secondary | ICD-10-CM | POA: Insufficient documentation

## 2019-05-08 DIAGNOSIS — Z79811 Long term (current) use of aromatase inhibitors: Secondary | ICD-10-CM | POA: Insufficient documentation

## 2019-05-08 MED ORDER — ANASTROZOLE 1 MG PO TABS: 1.0000 mg | ORAL_TABLET | Freq: Every day | ORAL | 3 refills | Status: DC

## 2019-05-08 MED ORDER — ALENDRONATE SODIUM 70 MG PO TABS: ORAL_TABLET | ORAL | 3 refills | Status: DC

## 2019-05-08 NOTE — Assessment & Plan Note (Signed)
Right lumpectomy 04/02/2016: IDC grade 2, 0.7 cm, margins negative, one benign lymph node, T1b N0 stage IA, ER 90%, PR 20%, HER-2 negative, Ki-67 12  Radiation: Completed 06/17/16. Treatment plan: Anastrozole 1 mg daily started June 2018  Anastrozole Toxicities: Occasional hot flashes  Breast cancer surveillance: 1.Mammogram 04/02/2018: Benign breast density category B 2.breast exam  05/08/2019: Benign   Osteoporosis T score -2.5 05/04/2019: Continue with Fosamax along with calcium and vitamin D. RTC in 1 year

## 2019-05-09 ENCOUNTER — Telehealth: Payer: Self-pay | Admitting: Hematology and Oncology

## 2019-05-09 NOTE — Telephone Encounter (Signed)
Scheduled per 04/12 los, patient has been called and notified. ?

## 2019-06-22 DIAGNOSIS — H524 Presbyopia: Secondary | ICD-10-CM | POA: Diagnosis not present

## 2019-06-22 DIAGNOSIS — H52203 Unspecified astigmatism, bilateral: Secondary | ICD-10-CM | POA: Diagnosis not present

## 2019-06-22 DIAGNOSIS — H5203 Hypermetropia, bilateral: Secondary | ICD-10-CM | POA: Diagnosis not present

## 2019-06-22 DIAGNOSIS — H2513 Age-related nuclear cataract, bilateral: Secondary | ICD-10-CM | POA: Diagnosis not present

## 2019-08-14 DIAGNOSIS — C50911 Malignant neoplasm of unspecified site of right female breast: Secondary | ICD-10-CM | POA: Diagnosis not present

## 2019-08-23 DIAGNOSIS — Z0120 Encounter for dental examination and cleaning without abnormal findings: Secondary | ICD-10-CM | POA: Diagnosis not present

## 2019-08-28 DIAGNOSIS — Z0120 Encounter for dental examination and cleaning without abnormal findings: Secondary | ICD-10-CM | POA: Diagnosis not present

## 2019-09-04 DIAGNOSIS — Z0120 Encounter for dental examination and cleaning without abnormal findings: Secondary | ICD-10-CM | POA: Diagnosis not present

## 2019-10-11 DIAGNOSIS — Z01419 Encounter for gynecological examination (general) (routine) without abnormal findings: Secondary | ICD-10-CM | POA: Diagnosis not present

## 2019-10-11 DIAGNOSIS — Z6826 Body mass index (BMI) 26.0-26.9, adult: Secondary | ICD-10-CM | POA: Diagnosis not present

## 2019-10-11 DIAGNOSIS — Z124 Encounter for screening for malignant neoplasm of cervix: Secondary | ICD-10-CM | POA: Diagnosis not present

## 2020-02-23 ENCOUNTER — Other Ambulatory Visit: Payer: Self-pay | Admitting: Family Medicine

## 2020-02-23 DIAGNOSIS — Z9889 Other specified postprocedural states: Secondary | ICD-10-CM

## 2020-02-26 DIAGNOSIS — J309 Allergic rhinitis, unspecified: Secondary | ICD-10-CM | POA: Diagnosis not present

## 2020-02-26 DIAGNOSIS — K219 Gastro-esophageal reflux disease without esophagitis: Secondary | ICD-10-CM | POA: Diagnosis not present

## 2020-02-26 DIAGNOSIS — M1990 Unspecified osteoarthritis, unspecified site: Secondary | ICD-10-CM | POA: Diagnosis not present

## 2020-02-26 DIAGNOSIS — Z0000 Encounter for general adult medical examination without abnormal findings: Secondary | ICD-10-CM | POA: Diagnosis not present

## 2020-04-15 ENCOUNTER — Other Ambulatory Visit: Payer: Self-pay

## 2020-04-15 ENCOUNTER — Other Ambulatory Visit: Payer: Self-pay | Admitting: Hematology and Oncology

## 2020-04-15 DIAGNOSIS — Z9889 Other specified postprocedural states: Secondary | ICD-10-CM

## 2020-04-18 ENCOUNTER — Other Ambulatory Visit: Payer: Self-pay

## 2020-04-18 ENCOUNTER — Ambulatory Visit (HOSPITAL_COMMUNITY): Admission: RE | Admit: 2020-04-18 | Payer: Medicare Other | Source: Ambulatory Visit

## 2020-04-18 ENCOUNTER — Other Ambulatory Visit: Payer: Self-pay | Admitting: Hematology and Oncology

## 2020-04-18 ENCOUNTER — Ambulatory Visit
Admission: RE | Admit: 2020-04-18 | Discharge: 2020-04-18 | Disposition: A | Discharge status: Home / Self Care | Payer: Medicare Other | Source: Ambulatory Visit | Attending: Family Medicine | Admitting: Family Medicine

## 2020-04-18 DIAGNOSIS — Z1231 Encounter for screening mammogram for malignant neoplasm of breast: Secondary | ICD-10-CM

## 2020-04-18 DIAGNOSIS — Z9889 Other specified postprocedural states: Secondary | ICD-10-CM

## 2020-05-06 NOTE — Progress Notes (Signed)
Patient Care Team: Kelton Pillar, MD as PCP - General (Family Medicine) Paula Compton, MD as Consulting Physician (Obstetrics and Gynecology) Erroll Luna, MD as Consulting Physician (General Surgery) Nicholas Lose, MD as Consulting Physician (Hematology and Oncology) Kyung Rudd, MD as Consulting Physician (Radiation Oncology) Gardenia Phlegm, NP as Nurse Practitioner (Hematology and Oncology)  DIAGNOSIS:    ICD-10-CM   1. Malignant neoplasm of lower-outer quadrant of right breast of female, estrogen receptor positive ([place])  C50.511    Z17.0     SUMMARY OF ONCOLOGIC HISTORY: Oncology History  Malignant neoplasm of lower-outer quadrant of right breast of female, estrogen receptor positive ([place])  03/02/2016 Initial Diagnosis   Right breast biopsy 8:00: Invasive ductal carcinoma grade 1-2, ER 90%, PR 20%, Ki-67 12%, HER-2 negative, screening detected right breast mass LOQ posterior depth 8:00 position 5 mm, axilla negative, T1 1 N0 stage IA clinical stage   04/02/2016 Surgery   Right lumpectomy ([facility]): IDC grade 2, 0.7 cm, margins negative, one benign lymph node, T1b N0 stage IA, ER 90%, PR 20%, HER-2 negative, Ki-67 12%   04/16/2016 Oncotype testing   Recurrence score: 15; ROR: 10%   05/21/2016 - 06/17/2016 Radiation Therapy   Adj XRT (moody):  1) Right breast/ 42.5 Gy in 17 fractions 2) Right breast boost/ 7.5 Gy in 3 fractions   06/2016 -  Anti-estrogen oral therapy   Anastrozole $RemoveBefo'1mg'[id number]$  daily     CHIEF COMPLIANT: Follow-up pf right breast cancer on anastrozole therapy  INTERVAL HISTORY: Teresa Mitchell is a 65 y.o. with above-mentioned history of right breast cancer treated with lumpectomy, radiation, and is currently on antiestrogen therapy with anastrozole. Mammogram on 04/22/20 showed no evidence of malignancy bilaterally. She presents to the clinic today for annual follow-up.   She recently hurt her back and is recovering from the back pain related to ruptured  disc.  ALLERGIES:  is allergic to no known allergies.  MEDICATIONS:  Current Outpatient Medications  Medication Sig Dispense Refill  . alendronate (FOSAMAX) 70 MG tablet TAKE ONE TABLET BY MOUTH ONCE WEEKLY ON AN EMPTY STOMACH BEFORE BREAKFAST. REMAIN UPRIGHT FOR 30 MINUTES AND TAKE WITH 8 OZ OF WATER 12 tablet 3  . anastrozole (ARIMIDEX) 1 MG tablet Take 1 tablet (1 mg total) by mouth daily. 90 tablet 3  . Biotin 1 MG CAPS Take by mouth.    . calcium-vitamin D (OSCAL WITH D) 500-200 MG-UNIT tablet Take 1 tablet by mouth 2 (two) times daily.    . diphenhydramine-acetaminophen (TYLENOL PM) 25-500 MG TABS tablet Take 1 tablet by mouth at bedtime as needed.    . Fish Oil OIL by Does not apply route.    Marland Kitchen MAGNESIUM ASPARTATE PO Take 200 mg by mouth 2 (two) times daily.     . pravastatin (PRAVACHOL) 20 MG tablet Take 20 mg by mouth daily.    . Turmeric 500 MG CAPS Take 500 mg by mouth every morning.    . vitamin B-12 (CYANOCOBALAMIN) 100 MCG tablet Take 3,000 mcg by mouth daily.      No current facility-administered medications for this visit.    PHYSICAL EXAMINATION: ECOG PERFORMANCE STATUS: 1 - Symptomatic but completely ambulatory  Vitals:   05/07/20 1001  BP: 111/66  Pulse: 72  Resp: 16  Temp: 97.7 F (36.5 C)  SpO2: 100%   Filed Weights   05/07/20 1001  Weight: 141 lb (64 kg)    BREAST: No palpable masses or nodules in either right or left breasts.  No palpable axillary supraclavicular or infraclavicular adenopathy no breast tenderness or nipple discharge. (exam performed in the presence of a chaperone)  LABORATORY DATA:  I have reviewed the data as listed CMP Latest Ref Rng & Units 03/11/2016  Glucose 70 - 140 mg/dl 99  BUN 7.0 - 26.0 mg/dL 9.1  Creatinine 0.6 - 1.1 mg/dL 0.7  Sodium 136 - 145 mEq/L 141  Potassium 3.5 - 5.1 mEq/L 3.9  CO2 22 - 29 mEq/L 26  Calcium 8.4 - 10.4 mg/dL 9.9  Total Protein 6.4 - 8.3 g/dL 7.4  Total Bilirubin 0.20 - 1.20 mg/dL 0.95   Alkaline Phos 40 - 150 U/L 60  AST 5 - 34 U/L 21  ALT 0 - 55 U/L 25    Lab Results  Component Value Date   WBC 5.0 03/11/2016   HGB 13.9 03/11/2016   HCT 41.8 03/11/2016   MCV 89.8 03/11/2016   PLT 177 03/11/2016   NEUTROABS 2.8 03/11/2016    ASSESSMENT & PLAN:  Malignant neoplasm of lower-outer quadrant of right breast of female, estrogen receptor positive ([place]) Right lumpectomy 04/02/2016: IDC grade 2, 0.7 cm, margins negative, one benign lymph node, T1b N0 stage IA, ER 90%, PR 20%, HER-2 negative, Ki-67 12  Radiation: Completed 06/17/16. Treatment plan: Anastrozole 1 mg daily started June 2018 I recommended 5 years of antiestrogen therapy.  She will finish this next year and then discontinue.  She is excited about that.  Anastrozole Toxicities: Occasional hot flashes Weight gain  She stays very active by riding [employer]ors on trails in [place]. She also rides horses.  Breast cancer surveillance: 1.Mammogram 04/22/2020: Benign breast density category B 2.breast exam 05/07/2020: Benign   Osteoporosis T score -2.5 05/04/2019: Continue with Fosamax along with calcium and vitamin D. RTC in 1 year    No orders of the defined types were placed in this encounter.  The patient has a good understanding of the overall plan. she agrees with it. she will call with any problems that may develop before the next visit here.  Total time spent: 20 mins including face to face time and time spent for planning, charting and coordination of care  Rulon Eisenmenger, MD, MPH 05/07/2020  I, Molly Dorshimer, am acting as scribe for Dr. Nicholas Lose.  I have reviewed the above documentation for accuracy and completeness, and I agree with the above.

## 2020-05-07 ENCOUNTER — Inpatient Hospital Stay: Payer: Medicare Other | Attending: Hematology and Oncology | Admitting: Hematology and Oncology

## 2020-05-07 ENCOUNTER — Other Ambulatory Visit: Payer: Self-pay

## 2020-05-07 DIAGNOSIS — Z170 Estrogen receptor positive status [ER+]: Secondary | ICD-10-CM | POA: Diagnosis not present

## 2020-05-07 DIAGNOSIS — Z79811 Long term (current) use of aromatase inhibitors: Secondary | ICD-10-CM | POA: Insufficient documentation

## 2020-05-07 DIAGNOSIS — M549 Dorsalgia, unspecified: Secondary | ICD-10-CM | POA: Insufficient documentation

## 2020-05-07 DIAGNOSIS — M810 Age-related osteoporosis without current pathological fracture: Secondary | ICD-10-CM | POA: Insufficient documentation

## 2020-05-07 DIAGNOSIS — Z923 Personal history of irradiation: Secondary | ICD-10-CM | POA: Insufficient documentation

## 2020-05-07 DIAGNOSIS — C50511 Malignant neoplasm of lower-outer quadrant of right female breast: Secondary | ICD-10-CM

## 2020-05-07 MED ORDER — ANASTROZOLE 1 MG PO TABS: 1.0000 mg | ORAL_TABLET | Freq: Every day | ORAL | 3 refills | Status: DC

## 2020-05-07 NOTE — Assessment & Plan Note (Signed)
Right lumpectomy 04/02/2016: IDC grade 2, 0.7 cm, margins negative, one benign lymph node, T1b N0 stage IA, ER 90%, PR 20%, HER-2 negative, Ki-67 12  Radiation: Completed 06/17/16. Treatment plan: Anastrozole 1 mg daily started June 2018  Anastrozole Toxicities: Occasional hot flashes  Breast cancer surveillance: 1.Mammogram 04/22/2020: Benign breast density category B 2.breast exam 05/07/2020: Benign   Osteoporosis T score -2.5 05/04/2019: Continue with Fosamax along with calcium and vitamin D. RTC in 1 year

## 2020-06-12 ENCOUNTER — Other Ambulatory Visit: Payer: Self-pay | Admitting: Hematology and Oncology

## 2020-06-25 DIAGNOSIS — H5203 Hypermetropia, bilateral: Secondary | ICD-10-CM | POA: Diagnosis not present

## 2020-08-16 DIAGNOSIS — C50412 Malignant neoplasm of upper-outer quadrant of left female breast: Secondary | ICD-10-CM | POA: Diagnosis not present

## 2020-08-16 DIAGNOSIS — Z170 Estrogen receptor positive status [ER+]: Secondary | ICD-10-CM | POA: Diagnosis not present

## 2020-08-19 DIAGNOSIS — H524 Presbyopia: Secondary | ICD-10-CM | POA: Diagnosis not present

## 2021-02-06 ENCOUNTER — Other Ambulatory Visit: Payer: Self-pay | Admitting: Hematology and Oncology

## 2021-02-06 DIAGNOSIS — Z1231 Encounter for screening mammogram for malignant neoplasm of breast: Secondary | ICD-10-CM

## 2021-02-12 DIAGNOSIS — D485 Neoplasm of uncertain behavior of skin: Secondary | ICD-10-CM | POA: Diagnosis not present

## 2021-02-12 DIAGNOSIS — L821 Other seborrheic keratosis: Secondary | ICD-10-CM | POA: Diagnosis not present

## 2021-02-12 DIAGNOSIS — D220 Melanocytic nevi of lip: Secondary | ICD-10-CM | POA: Diagnosis not present

## 2021-02-12 DIAGNOSIS — D225 Melanocytic nevi of trunk: Secondary | ICD-10-CM | POA: Diagnosis not present

## 2021-02-12 DIAGNOSIS — L853 Xerosis cutis: Secondary | ICD-10-CM | POA: Diagnosis not present

## 2021-02-12 DIAGNOSIS — L814 Other melanin hyperpigmentation: Secondary | ICD-10-CM | POA: Diagnosis not present

## 2021-02-12 DIAGNOSIS — D2272 Melanocytic nevi of left lower limb, including hip: Secondary | ICD-10-CM | POA: Diagnosis not present

## 2021-03-07 ENCOUNTER — Other Ambulatory Visit: Payer: Self-pay | Admitting: Family Medicine

## 2021-03-07 DIAGNOSIS — C50919 Malignant neoplasm of unspecified site of unspecified female breast: Secondary | ICD-10-CM | POA: Diagnosis not present

## 2021-03-07 DIAGNOSIS — M810 Age-related osteoporosis without current pathological fracture: Secondary | ICD-10-CM | POA: Diagnosis not present

## 2021-03-07 DIAGNOSIS — M1990 Unspecified osteoarthritis, unspecified site: Secondary | ICD-10-CM | POA: Diagnosis not present

## 2021-03-07 DIAGNOSIS — Z23 Encounter for immunization: Secondary | ICD-10-CM | POA: Diagnosis not present

## 2021-03-07 DIAGNOSIS — K219 Gastro-esophageal reflux disease without esophagitis: Secondary | ICD-10-CM | POA: Diagnosis not present

## 2021-03-07 DIAGNOSIS — J309 Allergic rhinitis, unspecified: Secondary | ICD-10-CM | POA: Diagnosis not present

## 2021-03-07 DIAGNOSIS — M8588 Other specified disorders of bone density and structure, other site: Secondary | ICD-10-CM | POA: Diagnosis not present

## 2021-03-07 DIAGNOSIS — E7800 Pure hypercholesterolemia, unspecified: Secondary | ICD-10-CM | POA: Diagnosis not present

## 2021-03-07 DIAGNOSIS — Z0000 Encounter for general adult medical examination without abnormal findings: Secondary | ICD-10-CM | POA: Diagnosis not present

## 2021-04-16 ENCOUNTER — Telehealth: Payer: Self-pay | Admitting: Hematology and Oncology

## 2021-04-16 NOTE — Telephone Encounter (Signed)
Rescheduled appointment per providers. Patient aware.  ? ?

## 2021-04-21 ENCOUNTER — Ambulatory Visit
Admission: RE | Admit: 2021-04-21 | Discharge: 2021-04-21 | Disposition: A | Discharge status: Home / Self Care | Payer: PPO | Source: Ambulatory Visit | Attending: Hematology and Oncology | Admitting: Hematology and Oncology

## 2021-04-21 ENCOUNTER — Ambulatory Visit: Payer: Medicare Other

## 2021-04-21 ENCOUNTER — Other Ambulatory Visit: Payer: Self-pay

## 2021-04-21 DIAGNOSIS — Z1231 Encounter for screening mammogram for malignant neoplasm of breast: Secondary | ICD-10-CM | POA: Diagnosis not present

## 2021-05-06 NOTE — Progress Notes (Signed)
? ?Patient Care Team: ?Kelton Pillar, MD as PCP - General (Family Medicine) ?Paula Compton, MD as Consulting Physician (Obstetrics and Gynecology) ?Erroll Luna, MD as Consulting Physician (General Surgery) ?Nicholas Lose, MD as Consulting Physician (Hematology and Oncology) ?Kyung Rudd, MD as Consulting Physician (Radiation Oncology) ?Gardenia Phlegm, NP as Nurse Practitioner (Hematology and Oncology) ? ?DIAGNOSIS:  ?Encounter Diagnosis  ?Name Primary?  ? Malignant neoplasm of lower-outer quadrant of right breast of female, estrogen receptor positive ([place])   ? ? ?SUMMARY OF ONCOLOGIC HISTORY: ?Oncology History  ?Malignant neoplasm of lower-outer quadrant of right breast of female, estrogen receptor positive ([place])  ?03/02/2016 Initial Diagnosis  ? Right breast biopsy 8:00: Invasive ductal carcinoma grade 1-2, ER 90%, PR 20%, Ki-67 12%, HER-2 negative, screening detected right breast mass LOQ posterior depth 8:00 position 5 mm, axilla negative, T1 1 N0 stage IA clinical stage ? ?  ?04/02/2016 Surgery  ? Right lumpectomy ([facility]): IDC grade 2, 0.7 cm, margins negative, one benign lymph node, T1b N0 stage IA, ER 90%, PR 20%, HER-2 negative, Ki-67 12% ? ?  ?04/16/2016 Oncotype testing  ? Recurrence score: 15; ROR: 10% ? ?  ?05/21/2016 - 06/17/2016 Radiation Therapy  ? Adj XRT (moody):  1) Right breast/ 42.5 Gy in 17 fractions 2) Right breast boost/ 7.5 Gy in 3 fractions ? ?  ?06/2016 -  Anti-estrogen oral therapy  ? Anastrozole $RemoveBefor'1mg'mpXlShdXQZLr$  daily ? ?  ? ? ?CHIEF COMPLIANT:  Follow-up pf right breast cancer on anastrozole therapy ? ?INTERVAL HISTORY: Teresa Mitchell is a 66 y.o. with above-mentioned history of right breast cancer treated with lumpectomy, radiation, and is currently on antiestrogen therapy with anastrozole. She presents to the clinic today for follow-up. She states that she tolerated the anastrozole.Denies pain or symptoms. ? ? ?ALLERGIES:  is allergic to no known allergies. ? ?MEDICATIONS:  ?Current  Outpatient Medications  ?Medication Sig Dispense Refill  ? alendronate (FOSAMAX) 70 MG tablet TAKE 1 TABLET BY MOUTH ONCE WEEKLY ON AN EMPTY STOMACH BEFORE BREAKFAST. REMAIN UPRIGHT FOR 30 MINUTES AND TAKE WITH 8 OUNCES OF WATER 12 tablet 3  ? calcium-vitamin D (OSCAL WITH D) 500-200 MG-UNIT tablet Take 1 tablet by mouth 2 (two) times daily.    ? diphenhydramine-acetaminophen (TYLENOL PM) 25-500 MG TABS tablet Take 1 tablet by mouth at bedtime as needed.    ? MAGNESIUM ASPARTATE PO Take 200 mg by mouth 2 (two) times daily.     ? pravastatin (PRAVACHOL) 20 MG tablet Take 20 mg by mouth daily.    ? vitamin B-12 (CYANOCOBALAMIN) 100 MCG tablet Take 3,000 mcg by mouth daily.     ? ?No current facility-administered medications for this visit.  ? ? ?PHYSICAL EXAMINATION: ?ECOG PERFORMANCE STATUS: 1 - Symptomatic but completely ambulatory ? ?Vitals:  ? 05/20/21 0940  ?BP: 139/71  ?Pulse: 81  ?Resp: 18  ?Temp: 97.8 ?F (36.6 ?C)  ?SpO2: 100%  ? ?Filed Weights  ? 05/20/21 0940  ?Weight: 141 lb 8 oz (64.2 kg)  ? ? ?BREAST: No palpable masses or nodules in either right or left breasts. No palpable axillary supraclavicular or infraclavicular adenopathy no breast tenderness or nipple discharge. (exam performed in the presence of a chaperone) ? ?LABORATORY DATA:  ?I have reviewed the data as listed ? ?  Latest Ref Rng & Units 03/11/2016  ? 12:49 PM  ?CMP  ?Glucose 70 - 140 mg/dl 99    ?BUN 7.0 - 26.0 mg/dL 9.1    ?Creatinine 0.6 - 1.1 mg/dL  0.7    ?Sodium 136 - 145 mEq/L 141    ?Potassium 3.5 - 5.1 mEq/L 3.9    ?CO2 22 - 29 mEq/L 26    ?Calcium 8.4 - 10.4 mg/dL 9.9    ?Total Protein 6.4 - 8.3 g/dL 7.4    ?Total Bilirubin 0.20 - 1.20 mg/dL 0.95    ?Alkaline Phos 40 - 150 U/L 60    ?AST 5 - 34 U/L 21    ?ALT 0 - 55 U/L 25    ? ? ?Lab Results  ?Component Value Date  ? WBC 5.0 03/11/2016  ? HGB 13.9 03/11/2016  ? HCT 41.8 03/11/2016  ? MCV 89.8 03/11/2016  ? PLT 177 03/11/2016  ? NEUTROABS 2.8 03/11/2016  ? ? ?ASSESSMENT & PLAN:   ?Malignant neoplasm of lower-outer quadrant of right breast of female, estrogen receptor positive ([place]) ?Right lumpectomy 04/02/2016: IDC grade 2, 0.7 cm, margins negative, one benign lymph node, T1b N0 stage IA, ER 90%, PR 20%, HER-2 negative, Ki-67 12 ?  ?Radiation: Completed 06/17/16. ?Treatment plan: Anastrozole 1 mg daily started June 2018 ?I recommended 5 years of antiestrogen therapy.  She will finish this next year and then discontinue.  She is excited about that. ?  ?Anastrozole Toxicities: ?Occasional hot flashes ?Weight gain ?  ?She stays very active by riding [employer]ors on trails in [place] to [place]. ?She also rides horses. ?  ?Breast cancer surveillance: ?1.  Mammogram 04/21/2021 : Benign breast density category B ?2. breast exam   05/20/2021: Benign  ?  ?Osteoporosis T score -2.5 05/04/2019: Continue with Fosamax along with calcium and vitamin D.   Bone density has been scheduled for August 2023 ? ?RTC on an as-needed basis. ? ? ? ?No orders of the defined types were placed in this encounter. ? ?The patient has a good understanding of the overall plan. she agrees with it. she will call with any problems that may develop before the next visit here. ?Total time spent: 30 mins including face to face time and time spent for planning, charting and co-ordination of care ? ? Harriette Ohara, MD ?05/20/21 ? ? ? I Gardiner Coins am scribing for Dr. Lindi Adie ? ?I have reviewed the above documentation for accuracy and completeness, and I agree with the above. ?. ?

## 2021-05-07 ENCOUNTER — Ambulatory Visit: Payer: Medicare Other | Admitting: Hematology and Oncology

## 2021-05-11 ENCOUNTER — Other Ambulatory Visit: Payer: Self-pay | Admitting: Hematology and Oncology

## 2021-05-20 ENCOUNTER — Other Ambulatory Visit: Payer: Self-pay

## 2021-05-20 ENCOUNTER — Inpatient Hospital Stay: Payer: PPO | Attending: Hematology and Oncology | Admitting: Hematology and Oncology

## 2021-05-20 DIAGNOSIS — Z923 Personal history of irradiation: Secondary | ICD-10-CM | POA: Insufficient documentation

## 2021-05-20 DIAGNOSIS — Z79899 Other long term (current) drug therapy: Secondary | ICD-10-CM | POA: Diagnosis not present

## 2021-05-20 DIAGNOSIS — Z79811 Long term (current) use of aromatase inhibitors: Secondary | ICD-10-CM | POA: Diagnosis not present

## 2021-05-20 DIAGNOSIS — Z170 Estrogen receptor positive status [ER+]: Secondary | ICD-10-CM

## 2021-05-20 DIAGNOSIS — C50511 Malignant neoplasm of lower-outer quadrant of right female breast: Secondary | ICD-10-CM

## 2021-05-20 DIAGNOSIS — M810 Age-related osteoporosis without current pathological fracture: Secondary | ICD-10-CM | POA: Insufficient documentation

## 2021-05-20 NOTE — Assessment & Plan Note (Signed)
Right lumpectomy 04/02/2016: IDC grade 2, 0.7 cm, margins negative, one benign lymph node, T1b N0 stage IA, ER 90%, PR 20%, HER-2 negative, Ki-67 12 ?? ?Radiation: Completed 06/17/16. ?Treatment plan: Anastrozole 1 mg daily started June 2018 ?I recommended 5 years of antiestrogen therapy.  She will finish this next year and then discontinue.  She is excited about that. ?? ?Anastrozole Toxicities: ?Occasional hot flashes ?Weight gain ?? ?She stays very active by riding [employer] razors on trails in [place]. ?She also rides horses. ?? ?Breast cancer surveillance: ?1.??Mammogram 04/21/2021 : Benign breast density category B ?2.?breast exam?? 05/20/2021: Benign  ?? ?Osteoporosis T score -2.5?05/04/2019:?Continue with?Fosamax along with calcium and vitamin D.???Bone density has been scheduled for August 2023 ?RTC in 1 year ?

## 2021-07-08 DIAGNOSIS — H52203 Unspecified astigmatism, bilateral: Secondary | ICD-10-CM | POA: Diagnosis not present

## 2021-07-08 DIAGNOSIS — H5203 Hypermetropia, bilateral: Secondary | ICD-10-CM | POA: Diagnosis not present

## 2021-07-08 DIAGNOSIS — H25813 Combined forms of age-related cataract, bilateral: Secondary | ICD-10-CM | POA: Diagnosis not present

## 2021-07-08 DIAGNOSIS — H524 Presbyopia: Secondary | ICD-10-CM | POA: Diagnosis not present

## 2021-09-16 DIAGNOSIS — M810 Age-related osteoporosis without current pathological fracture: Secondary | ICD-10-CM | POA: Diagnosis not present

## 2021-09-16 DIAGNOSIS — E7800 Pure hypercholesterolemia, unspecified: Secondary | ICD-10-CM | POA: Diagnosis not present

## 2021-09-16 DIAGNOSIS — K219 Gastro-esophageal reflux disease without esophagitis: Secondary | ICD-10-CM | POA: Diagnosis not present

## 2021-09-16 DIAGNOSIS — C50919 Malignant neoplasm of unspecified site of unspecified female breast: Secondary | ICD-10-CM | POA: Diagnosis not present

## 2021-09-18 ENCOUNTER — Ambulatory Visit
Admission: RE | Admit: 2021-09-18 | Discharge: 2021-09-18 | Disposition: A | Discharge status: Home / Self Care | Payer: PPO | Source: Ambulatory Visit | Attending: Family Medicine | Admitting: Family Medicine

## 2021-09-18 DIAGNOSIS — M810 Age-related osteoporosis without current pathological fracture: Secondary | ICD-10-CM

## 2021-09-18 DIAGNOSIS — M8589 Other specified disorders of bone density and structure, multiple sites: Secondary | ICD-10-CM | POA: Diagnosis not present

## 2021-09-18 DIAGNOSIS — Z780 Asymptomatic menopausal state: Secondary | ICD-10-CM | POA: Diagnosis not present

## 2022-03-10 DIAGNOSIS — E7800 Pure hypercholesterolemia, unspecified: Secondary | ICD-10-CM | POA: Diagnosis not present

## 2022-03-10 DIAGNOSIS — Z0000 Encounter for general adult medical examination without abnormal findings: Secondary | ICD-10-CM | POA: Diagnosis not present

## 2022-03-10 DIAGNOSIS — M1990 Unspecified osteoarthritis, unspecified site: Secondary | ICD-10-CM | POA: Diagnosis not present

## 2022-03-10 DIAGNOSIS — K219 Gastro-esophageal reflux disease without esophagitis: Secondary | ICD-10-CM | POA: Diagnosis not present

## 2022-03-10 DIAGNOSIS — F5101 Primary insomnia: Secondary | ICD-10-CM | POA: Diagnosis not present

## 2022-03-10 DIAGNOSIS — J309 Allergic rhinitis, unspecified: Secondary | ICD-10-CM | POA: Diagnosis not present

## 2022-03-10 DIAGNOSIS — M810 Age-related osteoporosis without current pathological fracture: Secondary | ICD-10-CM | POA: Diagnosis not present

## 2022-03-11 DIAGNOSIS — D2272 Melanocytic nevi of left lower limb, including hip: Secondary | ICD-10-CM | POA: Diagnosis not present

## 2022-03-11 DIAGNOSIS — D220 Melanocytic nevi of lip: Secondary | ICD-10-CM | POA: Diagnosis not present

## 2022-03-11 DIAGNOSIS — D1801 Hemangioma of skin and subcutaneous tissue: Secondary | ICD-10-CM | POA: Diagnosis not present

## 2022-03-11 DIAGNOSIS — L821 Other seborrheic keratosis: Secondary | ICD-10-CM | POA: Diagnosis not present

## 2022-03-11 DIAGNOSIS — D225 Melanocytic nevi of trunk: Secondary | ICD-10-CM | POA: Diagnosis not present

## 2022-03-11 DIAGNOSIS — L814 Other melanin hyperpigmentation: Secondary | ICD-10-CM | POA: Diagnosis not present

## 2022-03-24 ENCOUNTER — Other Ambulatory Visit: Payer: Self-pay | Admitting: Internal Medicine

## 2022-03-24 DIAGNOSIS — Z1231 Encounter for screening mammogram for malignant neoplasm of breast: Secondary | ICD-10-CM

## 2022-03-31 DIAGNOSIS — D220 Melanocytic nevi of lip: Secondary | ICD-10-CM | POA: Diagnosis not present

## 2022-05-03 ENCOUNTER — Other Ambulatory Visit: Payer: Self-pay | Admitting: Hematology and Oncology

## 2022-05-08 ENCOUNTER — Ambulatory Visit
Admission: RE | Admit: 2022-05-08 | Discharge: 2022-05-08 | Disposition: A | Discharge status: Home / Self Care | Payer: PPO | Source: Ambulatory Visit | Attending: Internal Medicine | Admitting: Internal Medicine

## 2022-05-08 DIAGNOSIS — Z1231 Encounter for screening mammogram for malignant neoplasm of breast: Secondary | ICD-10-CM | POA: Diagnosis not present

## 2022-05-28 IMAGING — MG MM DIGITAL SCREENING BILAT W/ TOMO AND CAD
8 series · 9 of 24 positions shown · non-contrast
Comparison: Previous exam(s).

CLINICAL DATA: Screening.

EXAM:
DIGITAL SCREENING BILATERAL MAMMOGRAM WITH TOMOSYNTHESIS AND CAD
TECHNIQUE: Bilateral screening digital craniocaudal and mediolateral oblique
mammograms were obtained. Bilateral screening digital breast
tomosynthesis was performed. The images were evaluated with
computer-aided detection.

[R CC synth-2D]
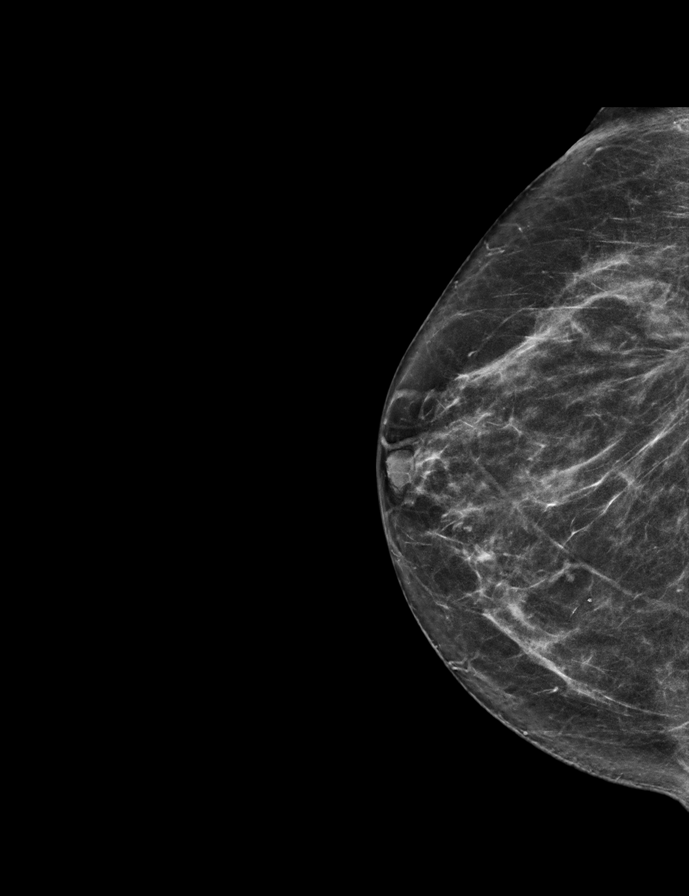

[R MLO synth-2D]
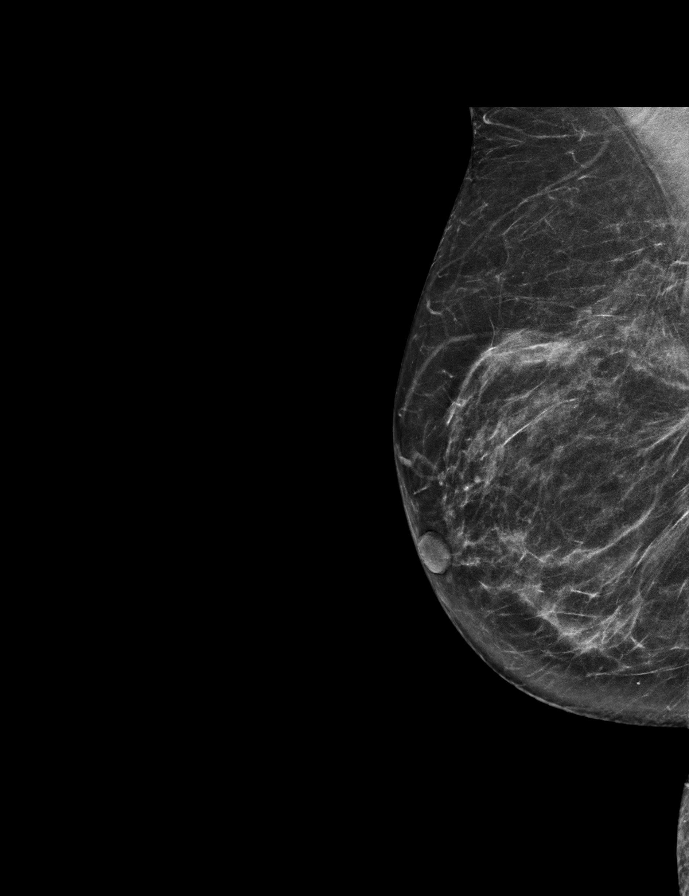

[L MLO synth-2D]
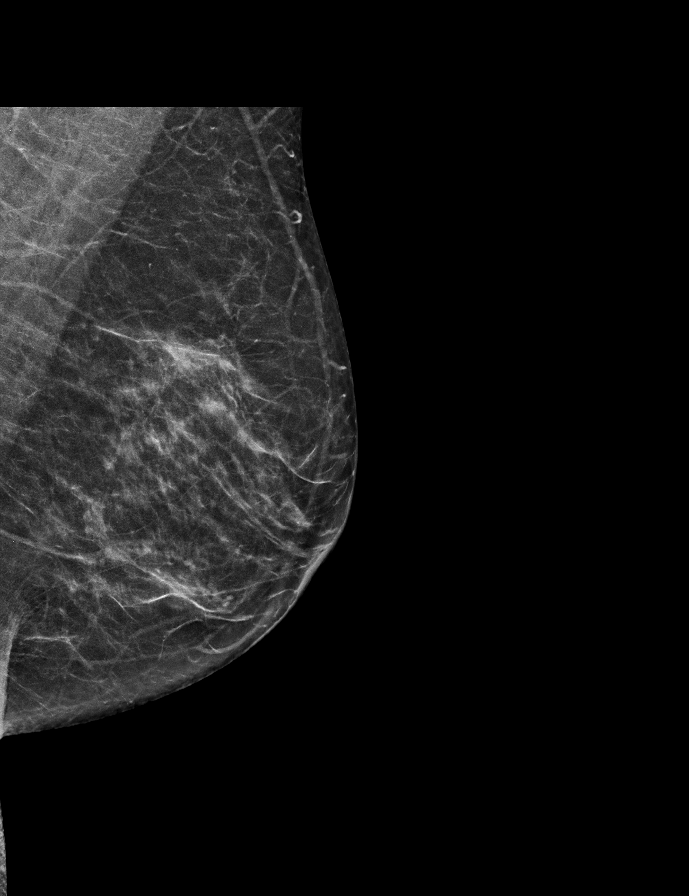

[L CC synth-2D]
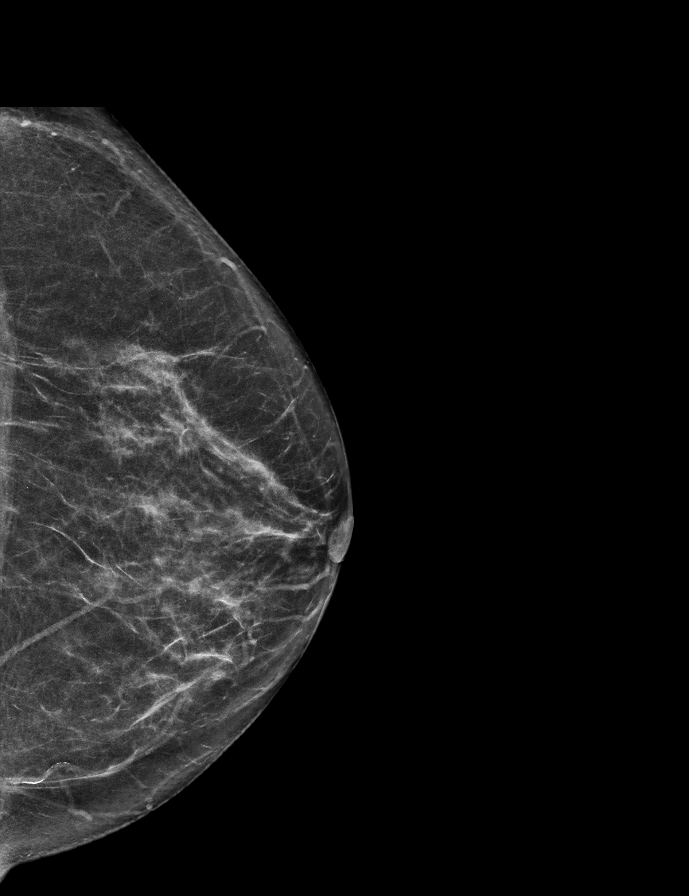

[R CC tomo · 2 of 64 frames shown]
[frame 21/64]
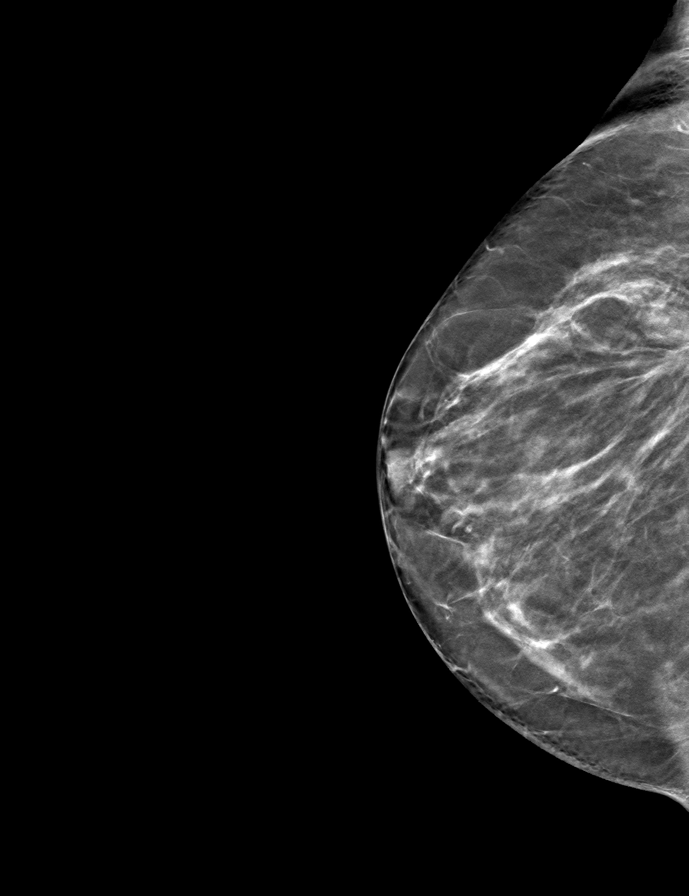
[frame 33/64]
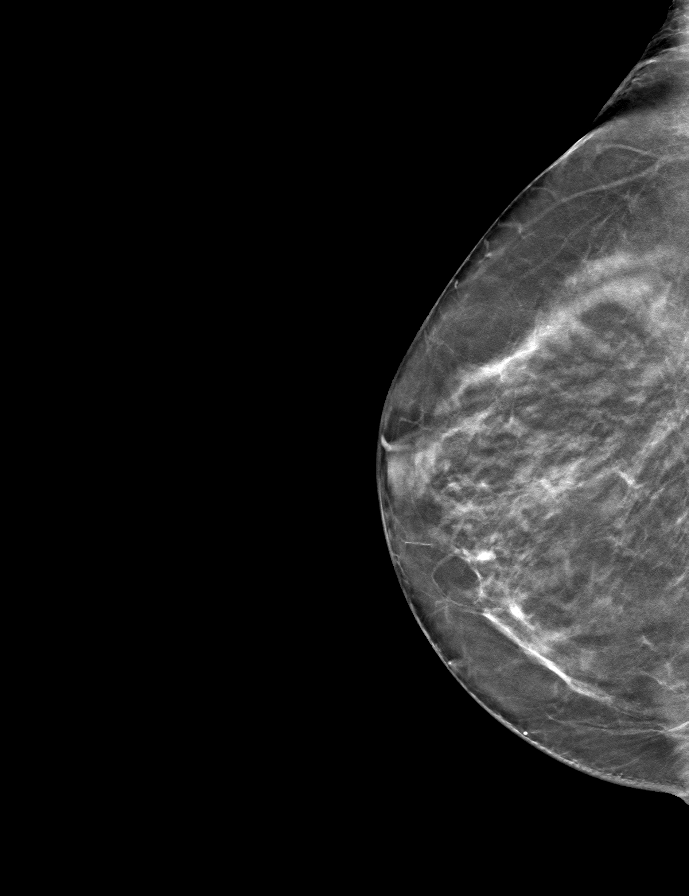

[R MLO tomo · tomo slice 31/61.0]
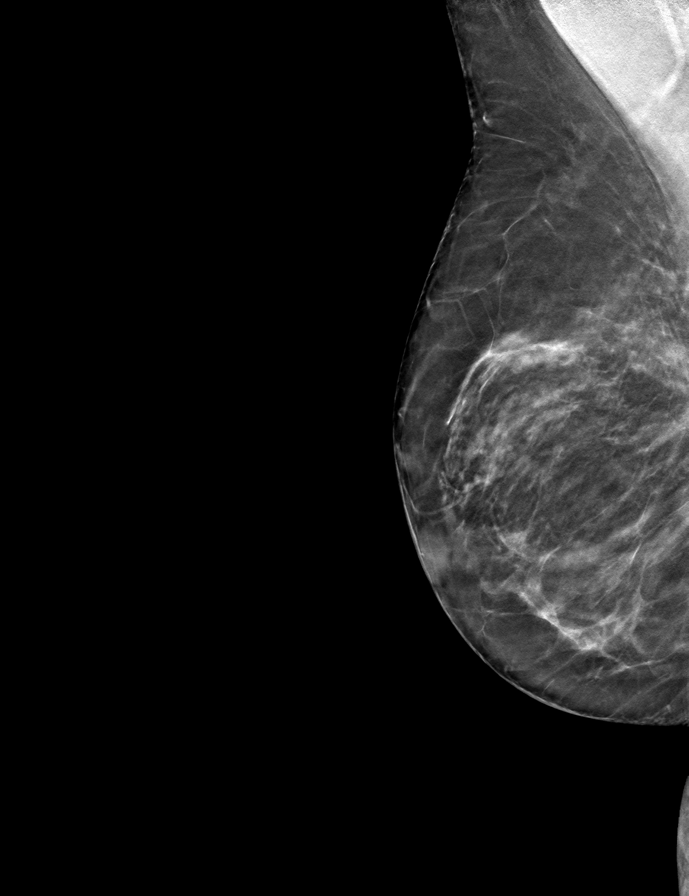

[L MLO tomo · tomo slice 29/58.0]
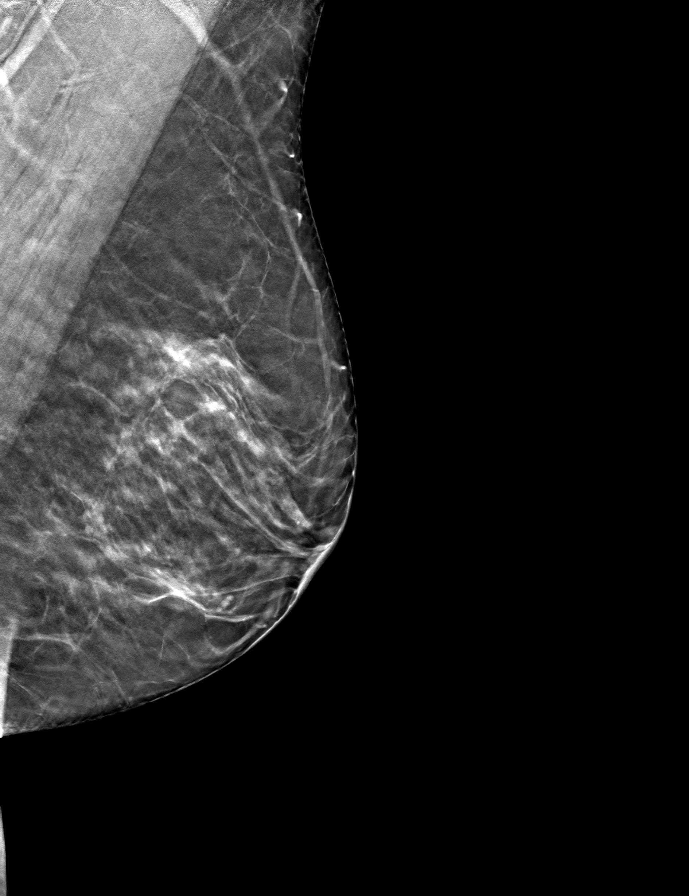

[L CC tomo · tomo slice 31/62.0]
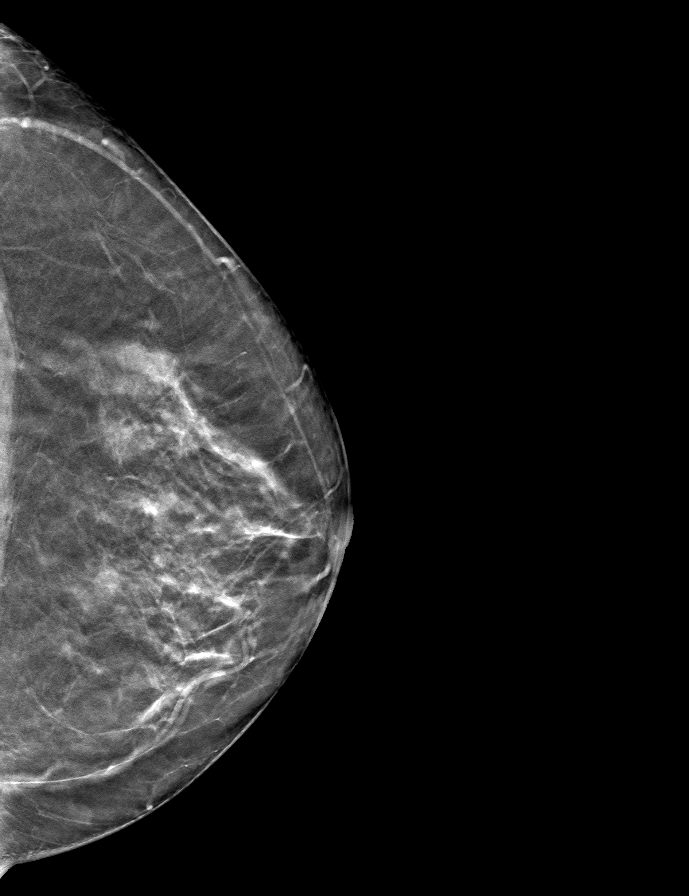

[9 of 24 positions shown; findings below may reference images not displayed]

ACR Breast Density Category b: There are scattered areas of
fibroglandular density.
FINDINGS: There are no findings suspicious for malignancy.
IMPRESSION: No mammographic evidence of malignancy. A result letter of this
screening mammogram will be mailed directly to the patient.

RECOMMENDATION:
Screening mammogram in one year. (Code:51-O-LD2)

BI-RADS CATEGORY  1: Negative.

## 2022-07-26 ENCOUNTER — Other Ambulatory Visit: Payer: Self-pay | Admitting: Hematology and Oncology

## 2022-10-28 ENCOUNTER — Other Ambulatory Visit: Payer: Self-pay | Admitting: Hematology and Oncology

## 2022-11-09 DIAGNOSIS — H2513 Age-related nuclear cataract, bilateral: Secondary | ICD-10-CM | POA: Diagnosis not present

## 2022-11-09 DIAGNOSIS — H04123 Dry eye syndrome of bilateral lacrimal glands: Secondary | ICD-10-CM | POA: Diagnosis not present

## 2022-11-09 DIAGNOSIS — H524 Presbyopia: Secondary | ICD-10-CM | POA: Diagnosis not present

## 2023-01-18 ENCOUNTER — Other Ambulatory Visit: Payer: Self-pay | Admitting: Hematology and Oncology

## 2023-03-30 ENCOUNTER — Other Ambulatory Visit: Payer: Self-pay | Admitting: Internal Medicine

## 2023-03-30 DIAGNOSIS — Z0000 Encounter for general adult medical examination without abnormal findings: Secondary | ICD-10-CM

## 2023-04-19 DIAGNOSIS — D225 Melanocytic nevi of trunk: Secondary | ICD-10-CM | POA: Diagnosis not present

## 2023-04-19 DIAGNOSIS — D2272 Melanocytic nevi of left lower limb, including hip: Secondary | ICD-10-CM | POA: Diagnosis not present

## 2023-04-19 DIAGNOSIS — D2261 Melanocytic nevi of right upper limb, including shoulder: Secondary | ICD-10-CM | POA: Diagnosis not present

## 2023-04-19 DIAGNOSIS — D2271 Melanocytic nevi of right lower limb, including hip: Secondary | ICD-10-CM | POA: Diagnosis not present

## 2023-04-19 DIAGNOSIS — L814 Other melanin hyperpigmentation: Secondary | ICD-10-CM | POA: Diagnosis not present

## 2023-04-19 DIAGNOSIS — D1801 Hemangioma of skin and subcutaneous tissue: Secondary | ICD-10-CM | POA: Diagnosis not present

## 2023-04-19 DIAGNOSIS — L821 Other seborrheic keratosis: Secondary | ICD-10-CM | POA: Diagnosis not present

## 2023-05-10 ENCOUNTER — Ambulatory Visit
Admission: RE | Admit: 2023-05-10 | Discharge: 2023-05-10 | Disposition: A | Discharge status: Home / Self Care | Source: Ambulatory Visit | Attending: Internal Medicine

## 2023-05-10 DIAGNOSIS — Z0000 Encounter for general adult medical examination without abnormal findings: Secondary | ICD-10-CM

## 2023-05-10 DIAGNOSIS — Z1231 Encounter for screening mammogram for malignant neoplasm of breast: Secondary | ICD-10-CM | POA: Diagnosis not present

## 2023-05-17 DIAGNOSIS — H04123 Dry eye syndrome of bilateral lacrimal glands: Secondary | ICD-10-CM | POA: Diagnosis not present

## 2023-05-17 DIAGNOSIS — H2513 Age-related nuclear cataract, bilateral: Secondary | ICD-10-CM | POA: Diagnosis not present
